# Patient Record
Sex: Female | Born: 1995 | Race: White | Hispanic: No | Marital: Single | Smoking: Never smoker
Health system: Southern US, Community
[De-identification: ages and names within clinical notes are randomized; demographics above are authoritative.]

## PROBLEM LIST (undated history)

## (undated) DIAGNOSIS — G249 Dystonia, unspecified: Secondary | ICD-10-CM

## (undated) DIAGNOSIS — G241 Genetic torsion dystonia: Secondary | ICD-10-CM

## (undated) DIAGNOSIS — L309 Dermatitis, unspecified: Secondary | ICD-10-CM

## (undated) HISTORY — DX: Genetic torsion dystonia: G24.1

## (undated) HISTORY — DX: Dermatitis, unspecified: L30.9

---
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
## 2017-01-07 ENCOUNTER — Telehealth: Payer: Self-pay | Admitting: Neurology

## 2017-01-07 NOTE — Telephone Encounter (Signed)
I received dopa responsive dystonia panel from [facility], which was done through [facility] in [place], [place].  It was negative.

## 2017-11-15 NOTE — Progress Notes (Signed)
Martha Bray was seen today in neurologic consultation at the request of Dr. Charlies Bray at [facility] neurology.  Her PCP is Martha Craze, NP.  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  The records that were made available to me were reviewed.  This patient is accompanied in the office by her mother who supplements the history.  The patient is a 22 y.o. year old female with a history of CP.   Mom states that she was slightly delayed in walking (13 months).  She walked normally initially and then at the age of 26 y/o, her mom noted gait instability, lack of heel toe walking, and walking on the toe.  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  She saw a new neurologist in [place] and he heard the hx that she was normal in the AM and then couldn't walk in the PM and her mom would have to carry her.  He didn't think that she had CP.  He did an LP and was told that genetic markers for DRD were negative.  She was started on carbidopa levodopa at age 70 y/o.  She was originally started on tid and then had decreased tone but they changed the dose and at 1/4 of pill she could walk (was WC bound prior).  Currently she takes, carbidopa/levodopa 25/100 CR in the AM and if she needs it or woke up late she will take a carbidopa/levodopa 25/100 IR as needed.  She does that 2-3 times per week.  If she forgets medicine, she will have her shoulders rise to her ears (either side), her calves will tighten, her back will hyperextend.  She apparently had genetic testing for dopa responsive dystonia that was negative.  She had testing for CSF neurotransmitters in the past that was negative.     Neuroimaging has  previously been performed.  It is not available for my review today.  Mom thinks that it was a CT brain.    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    11/18/17 update: Patient is seen today in follow-up.  She has not been seen in about 1 year.  Patient is doing well on carbidopa/levodopa 25/100 CR, 1 tablet in the morning and if she needs it she will take a carbidopa/levodopa 25/100 IR as needed.  She does that perhaps a few times per week (she estimates about 3).  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  She has not had any falls since our last visit.  She has not had any dyskinesia.  She continues to respond well to levodopa.  She is still attending [employer].  She states that she is now a sophomore.  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  Her goal is stage management.    ALLERGIES:  No Known Allergies  CURRENT MEDICATIONS:  Outpatient Encounter Medications as of 11/18/2017  Medication Sig  . carbidopa-levodopa (SINEMET IR) 25-100 MG per tablet Take 1 tablet by mouth daily as needed.   . Carbidopa-Levodopa ER (SINEMET CR) 25-100 MG tablet controlled release Take 1 tablet by mouth daily.  Marland Kitchen  clotrimazole-betamethasone (LOTRISONE) lotion Apply topically 2 (two) times daily.  Marland Kitchen desogestrel-ethinyl estradiol (KARIVA,AZURETTE,MIRCETTE) 0.15-0.02/0.01 MG (21/5) tablet Take 1 tablet by mouth daily.   No facility-administered encounter medications on file as of 11/18/2017.     PAST MEDICAL HISTORY:   Past Medical History:  Diagnosis Date  . Dermatitis 11/14/2016  . DOPA responsive dystonia   . Dystonia    doparesponsive dystonia    PAST SURGICAL HISTORY:   Past Surgical History:  Procedure Laterality Date  . NO PAST SURGERIES      SOCIAL HISTORY:   Social History   Socioeconomic History  . Marital status: Single    Spouse name: Not on file  . Number of children: Not on file  . Years of education: Not on file  . Highest education level: Not on  file  Social Needs  . Financial resource strain: Not on file  . Food insecurity - worry: Not on file  . Food insecurity - inability: Not on file  . Transportation needs - medical: Not on file  . Transportation needs - non-medical: Not on file  Occupational History  . Occupation: Consulting civil engineer  Tobacco Use  . Smoking status: Never Smoker  . Smokeless tobacco: Never Used  Substance and Sexual Activity  . Alcohol use: No    Alcohol/week: 0.0 oz  . Drug use: No  . Sexual activity: Not on file  Other Topics Concern  . Not on file  Social History Narrative   Lives with Mom    FAMILY HISTORY:   Family Status  Relation Name Status  . Mother  Alive       healthy  . Father  Alive       healthy  . Sister 2 Alive  . Brother 2 Alive  . Sister sepsis Deceased    ROS:  A complete 10 system review of systems was obtained and was unremarkable apart from what is mentioned above.  PHYSICAL EXAMINATION:    VITALS:   There were no vitals filed for this visit.  GEN:  Normal appears female in no acute distress.  Appears stated age. HEENT:  Normocephalic, atraumatic. The mucous membranes are moist. The superficial temporal arteries are without ropiness or tenderness. Cardiovascular: Regular rate and rhythm. Lungs: Clear to auscultation bilaterally. Neck/Heme: There are no carotid bruits noted bilaterally.  NEUROLOGICAL: Orientation:  The patient is alert and oriented x 3.  Fund of knowledge is appropriate.  Recent and remote memory intact.  Attention span and concentration normal.  Repeats and names without difficulty. Cranial nerves: There is good facial symmetry. The pupils are equal round and reactive to light bilaterally. Fundoscopic exam reveals clear disc margins bilaterally. Extraocular muscles are intact and visual fields are full to confrontational testing. Speech is fluent and clear. Soft palate rises symmetrically and there is no tongue deviation. Hearing is intact to conversational  tone. Tone: Tone is good throughout. Sensation: Sensation is intact to light touch throughout Coordination:  The patient has no difficulty with RAM's or FNF bilaterally. Motor: Strength is 5/5 in the bilateral upper and lower extremities.  Shoulder shrug is equal and symmetric. There is no pronator drift.  There are no fasciculations noted. DTR's: Deep tendon reflexes are 2+-3-/4 at the bilateral biceps, triceps, brachioradialis, patella and achilles.  Plantar responses are downgoing bilaterally. Gait and Station: The patient is able to ambulate without difficulty. Able to run down and walk down hall without difficulty   IMPRESSION/PLAN  1. Dopa Responsive Dystonia  -She has been  stable on carbidopa/levodopa 25/100 CR in the AM and carbidopa/levodopa 25/100 IR prn.  She will continue on this medication.  -currently on birth control pill.  Discussed not getting pregnant on carbidopa/levodopa 2.  F/u 1 year, sooner should new issues arise.     Cc:  Martha Bray'Sullivan, Melissa, NP

## 2017-11-18 ENCOUNTER — Encounter: Payer: Self-pay | Admitting: Neurology

## 2017-11-18 ENCOUNTER — Ambulatory Visit: Payer: BLUE CROSS/BLUE SHIELD | Admitting: Neurology

## 2017-11-18 VITALS — BP 110/66 | HR 94 | Ht 64.0 in | Wt 194.0 lb

## 2017-11-18 DIAGNOSIS — G241 Genetic torsion dystonia: Secondary | ICD-10-CM

## 2017-11-18 MED ORDER — CARBIDOPA-LEVODOPA ER 25-100 MG PO TBCR: 1.0000 | EXTENDED_RELEASE_TABLET | Freq: Every day | ORAL | 11 refills | Status: DC

## 2017-11-18 MED ORDER — CARBIDOPA-LEVODOPA 25-100 MG PO TABS: 1.0000 | ORAL_TABLET | Freq: Every day | ORAL | 11 refills | Status: DC | PRN

## 2018-03-12 HISTORY — PX: WISDOM TOOTH EXTRACTION: SHX21

## 2018-11-14 NOTE — Progress Notes (Signed)
Martha Bray was seen today in neurologic consultation at the request of Dr. Charlies Silvers at [facility] neurology.  Her PCP is Sandford Craze, NP.  The consultation is for the ev2aluation of DOPA responsive dystonia.  The records that were made available to me were reviewed.  This patient is accompanied in the office by her mother who supplements the history.  The patient is a 23 y.o. year old female with a history of CP.   Mom states that she was slightly delayed in walking (13 months).  She walked normally initially and then at the age of 41 y/o, her mom noted gait instability, lack of heel toe walking, and walking on the toe.  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  She saw a new neurologist in [place] and he heard the hx that she was normal in the AM and then couldn't walk in the PM and her mom would have to carry her.  He didn't think that she had CP.  He did an LP and was told that genetic markers for DRD were negative.  She was started on carbidopa levodopa at age 71 y/o.  She was originally started on tid and then had decreased tone but they changed the dose and at 1/4 of pill she could walk (was WC bound prior).  Currently she takes, carbidopa/levodopa 25/100 CR in the AM and if she needs it or woke up late she will take a carbidopa/levodopa 25/100 IR as needed.  She does that 2-3 times per week.  If she forgets medicine, she will have her shoulders rise to her ears (either side), her calves will tighten, her back will hyperextend.  She apparently had genetic testing for dopa responsive dystonia that was negative.  She had testing for CSF neurotransmitters in the past that was negative.     Neuroimaging has  previously been performed.  It is not available for my review today.  Mom thinks that it was a CT brain.    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    11/18/17 update: Patient is seen today in follow-up.  She has not been seen in about 1 year.  Patient is doing well on carbidopa/levodopa 25/100 CR, 1 tablet in the morning and if she needs it she will take a carbidopa/levodopa 25/100 IR as needed.  She does that perhaps a few times per week (she estimates about 3).  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  She has not had any falls since our last visit.  She has not had any dyskinesia.  She continues to respond well to levodopa.  She is still attending [employer].  She states that she is now a sophomore.  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  Her goal is stage management.    11/18/18 update: Patient seen today for dopa responsive dystonia.  She is accompanied by her mother who supplements the history.  She feels that she has been stable on carbidopa/levodopa 25/100 CR, 1 tablet in the morning and if she needs it, she takes an additional carbidopa/levodopa 25/100 IR.  She does this 2  or 3 days/week.  She really thinks that she needs to be on the medication.  She thinks it helps.  She denies side effects.  Her mother asks if either the medication or the disease causes anxiety.  She is under more stress and the patient thinks that school stress has just caused more anxiety.  ALLERGIES:  No Known Allergies  CURRENT MEDICATIONS:  Outpatient Encounter Medications as of 11/18/2018  Medication Sig  . carbidopa-levodopa (SINEMET IR) 25-100 MG tablet Take 1 tablet by mouth daily as needed.  . Carbidopa-Levodopa ER (SINEMET CR) 25-100 MG tablet controlled release Take 1 tablet by mouth daily.  . clotrimazole-betamethasone (LOTRISONE) lotion Apply topically 2 (two) times daily.  Marland Kitchen desogestrel-ethinyl estradiol (KARIVA,AZURETTE,MIRCETTE) 0.15-0.02/0.01 MG (21/5) tablet Take  1 tablet by mouth daily.   No facility-administered encounter medications on file as of 11/18/2018.     PAST MEDICAL HISTORY:   Past Medical History:  Diagnosis Date  . Dermatitis 11/14/2016  . DOPA responsive dystonia   . Dystonia    doparesponsive dystonia    PAST SURGICAL HISTORY:   Past Surgical History:  Procedure Laterality Date  . WISDOM TOOTH EXTRACTION  03/2018    SOCIAL HISTORY:   Social History   Socioeconomic History  . Marital status: Single    Spouse name: Not on file  . Number of children: Not on file  . Years of education: Not on file  . Highest education level: Not on file  Occupational History  . Occupation: Consulting civil engineer  Social Needs  . Financial resource strain: Not on file  . Food insecurity:    Worry: Not on file    Inability: Not on file  . Transportation needs:    Medical: Not on file    Non-medical: Not on file  Tobacco Use  . Smoking status: Never Smoker  . Smokeless tobacco: Never Used  Substance and Sexual Activity  . Alcohol use: No    Alcohol/week: 0.0 standard drinks  . Drug use: No  . Sexual activity: Not on file  Lifestyle  . Physical activity:    Days per week: Not on file    Minutes per session: Not on file  . Stress: Not on file  Relationships  . Social connections:    Talks on phone: Not on file    Gets together: Not on file    Attends religious service: Not on file    Active member of club or organization: Not on file    Attends meetings of clubs or organizations: Not on file    Relationship status: Not on file  . Intimate partner violence:    Fear of current or ex partner: Not on file    Emotionally abused: Not on file    Physically abused: Not on file    Forced sexual activity: Not on file  Other Topics Concern  . Not on file  Social History Narrative   Lives with Mom    FAMILY HISTORY:   Family Status  Relation Name Status  . Mother  Alive       healthy  . Father  Alive       healthy  . Sister 2 Alive  .  Brother 2 Alive  . Sister sepsis Deceased    ROS:  Review of Systems  Constitutional: Positive for malaise/fatigue.  HENT: Negative.   Eyes: Negative.   Gastrointestinal: Negative.   Skin: Negative.   Endo/Heme/Allergies: Negative.   Psychiatric/Behavioral: The patient is nervous/anxious.  PHYSICAL EXAMINATION:    VITALS:   Vitals:   11/18/18 1048  BP: 106/72  Pulse: 80  SpO2: 96%  Weight: 201 lb (91.2 kg)  Height: 5\' 4"  (1.626 m)    GEN:  Normal appears female in no acute distress.  Appears stated age. HEENT:  Normocephalic, atraumatic. The mucous membranes are moist. The superficial temporal arteries are without ropiness or tenderness. Cardiovascular: Regular rate and rhythm. Lungs: Clear to auscultation bilaterally. Neck/Heme: There are no carotid bruits noted bilaterally.  NEUROLOGICAL: Orientation:  The patient is alert and oriented x 3.  Fund of knowledge is appropriate.  Recent and remote memory intact.  Attention span and concentration normal.  Repeats and names without difficulty. Cranial nerves: There is good facial symmetry.  Extraocular muscles are intact and visual fields are full to confrontational testing. Speech is fluent and clear. Soft palate rises symmetrically and there is no tongue deviation. Hearing is intact to conversational tone. Tone: Tone is good throughout. Sensation: Sensation is intact to light touch throughout Coordination:  The patient has no difficulty with RAM's or FNF bilaterally. Motor: Strength is 5/5 in the bilateral upper and lower extremities.  Shoulder shrug is equal and symmetric. There is no pronator drift.  There are no fasciculations noted. DTR's: Deep tendon reflexes are 2+-3-/4 at the bilateral biceps, triceps, brachioradialis, patella and achilles.  Plantar responses are downgoing bilaterally. Gait and Station: The patient is able to ambulate without difficulty. Able to run down and walk down hall without difficulty (same as  previous)   IMPRESSION/PLAN  1. Dopa Responsive Dystonia  -She has been stable on carbidopa/levodopa 25/100 CR in the AM and carbidopa/levodopa 25/100 IR prn.  She will continue on this medication.  -currently on birth control pill.  Discussed extensively the importance of not getting pregnant while on this medication.  In her case, I would likely take her off of the medication if she were to try to get pregnant.  Discussed importance of pregnancy planning.  -Chemistry, TSH today  2.  Anxiety  -Discussed with the patient's mother and the patient that I do not think anxiety is related to her levodopa nor dystonia, but rather school stress.  The patient agrees completely.  She is not suicidal or homicidal.  3.  We will plan on seeing the patient back in 1 year.    Cc:  Sandford Craze'Sullivan, Melissa, NP

## 2018-11-18 ENCOUNTER — Ambulatory Visit: Payer: BLUE CROSS/BLUE SHIELD | Admitting: Neurology

## 2018-11-18 ENCOUNTER — Encounter: Payer: Self-pay | Admitting: Neurology

## 2018-11-18 ENCOUNTER — Other Ambulatory Visit (INDEPENDENT_AMBULATORY_CARE_PROVIDER_SITE_OTHER): Payer: BLUE CROSS/BLUE SHIELD

## 2018-11-18 VITALS — BP 106/72 | HR 80 | Ht 64.0 in | Wt 201.0 lb

## 2018-11-18 DIAGNOSIS — G241 Genetic torsion dystonia: Secondary | ICD-10-CM

## 2018-11-18 DIAGNOSIS — F419 Anxiety disorder, unspecified: Secondary | ICD-10-CM

## 2018-11-18 DIAGNOSIS — Z5181 Encounter for therapeutic drug level monitoring: Secondary | ICD-10-CM

## 2018-11-18 MED ORDER — CARBIDOPA-LEVODOPA ER 25-100 MG PO TBCR: 1.0000 | EXTENDED_RELEASE_TABLET | Freq: Every day | ORAL | 11 refills | Status: DC

## 2018-11-18 MED ORDER — CARBIDOPA-LEVODOPA 25-100 MG PO TABS: 1.0000 | ORAL_TABLET | Freq: Every day | ORAL | 11 refills | Status: DC | PRN

## 2018-11-18 NOTE — Patient Instructions (Signed)
1. Your provider has requested that you have labwork completed today. Please go to [facility] ([id number]) on the second floor of this building before leaving the office today. You do not need to check in. If you are not called within 15 minutes please check with the front desk.   

## 2018-11-19 ENCOUNTER — Telehealth: Payer: Self-pay | Admitting: Neurology

## 2018-11-19 LAB — COMPREHENSIVE METABOLIC PANEL
AG Ratio: 1.4 (calc) (ref 1.0–2.5)
ALT: 3 U/L — AB (ref 6–29)
AST: 10 U/L (ref 10–30)
Albumin: 3.7 g/dL (ref 3.6–5.1)
Alkaline phosphatase (APISO): 71 U/L (ref 33–115)
BUN: 14 mg/dL (ref 7–25)
CO2: 25 mmol/L (ref 20–32)
Calcium: 9 mg/dL (ref 8.6–10.2)
Chloride: 105 mmol/L (ref 98–110)
Creat: 0.62 mg/dL (ref 0.50–1.10)
Globulin: 2.7 g/dL (calc) (ref 1.9–3.7)
Glucose, Bld: 78 mg/dL (ref 65–99)
Potassium: 4.2 mmol/L (ref 3.5–5.3)
Sodium: 138 mmol/L (ref 135–146)
Total Bilirubin: 0.3 mg/dL (ref 0.2–1.2)
Total Protein: 6.4 g/dL (ref 6.1–8.1)

## 2018-11-19 LAB — TSH: TSH: 2.27 mIU/L

## 2018-11-19 NOTE — Telephone Encounter (Signed)
Left message on machine for patient to call back.

## 2018-11-19 NOTE — Telephone Encounter (Signed)
-----   Message from Octaviano Batty Tat, DO sent at 11/19/2018 10:02 AM EST ----- I have reviewed all lab results which are normal or stable. Please inform the patient.

## 2018-11-24 NOTE — Telephone Encounter (Signed)
Patient called back and she was made aware labs okay.

## 2019-07-07 LAB — HM PAP SMEAR: HM Pap smear: NEGATIVE

## 2019-11-17 NOTE — Progress Notes (Signed)
Virtual Visit via Video Note The purpose of this virtual visit is to provide medical care while limiting exposure to the novel coronavirus.    Consent was obtained for video visit:  Yes.   Answered questions that patient had about telehealth interaction:  Yes.   I discussed the limitations, risks, security and privacy concerns of performing an evaluation and management service by telemedicine. I also discussed with the patient that there may be a patient responsible charge related to this service. The patient expressed understanding and agreed to proceed.  Pt location: Home Physician Location: office Name of referring provider:  Debbrah Alar, NP I connected with Martha Bray at patients initiation/request on 11/19/2019 at 11:15 AM EST by video enabled telemedicine application and verified that I am speaking with the correct person using two identifiers. 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 My previous records as well as any outside records made available were reviewed prior to todays visit. Patient has been stable in regards to dystonia.   Little more stressed right now and when that happens she will take carbidopa/levodopa bid.  Other times, just qd. Saw GYN in august.  Notes reviewed.  On oral contraceptives.  Current movement d/o meds:  Carbidopa/levodopa 25/100 CR, 1 tablet in the morning Carbidopa/levodopa 25/100 IR in the p.m. (only two-three days per week does she do it bid)   Current Outpatient Medications on File Prior to Visit  Medication Sig Dispense Refill  . carbidopa-levodopa (SINEMET IR) 25-100 MG tablet Take 1 tablet by mouth daily as needed. 30 tablet 11  . Carbidopa-Levodopa ER (SINEMET CR) 25-100 MG tablet controlled release Take 1 tablet by mouth daily. 30 tablet 11  . clotrimazole-betamethasone (LOTRISONE) lotion Apply  topically 2 (two) times daily. 30 mL 0  . desogestrel-ethinyl estradiol (KARIVA,AZURETTE,MIRCETTE) 0.15-0.02/0.01 MG (21/5) tablet Take 1 tablet by mouth daily.     No current facility-administered medications on file prior to visit.     Observations/Objective:   There were no vitals filed for this visit. GEN:  The patient appears stated age and is in NAD.  Neurological examination:  Orientation: The patient is alert and oriented x3. Cranial nerves: There is good facial symmetry. EOMI.  There is no facial hypomimia.  The speech is fluent and clear. Soft palate rises symmetrically and there is no tongue deviation. Hearing is intact to conversational tone. Motor: Strength is at least antigravity x 4.   Shoulder shrug is equal and symmetric.  There is no pronator drift.  Movement examination: Tone: unable Abnormal movements: none Coordination:  There is no decremation with RAM's,  Gait and Station: The patient has no difficulty arising out of a deep-seated chair without the use of the hands. The patient's stride length is good.  Ambulates in a tandem fashion.    I have reviewed and interpreted the following labs independently   Chemistry      Component Value Date/Time   NA 138 11/18/2018 1136   K 4.2 11/18/2018 1136   CL 105 11/18/2018 1136   CO2 25 11/18/2018 1136   BUN 14 11/18/2018 1136   CREATININE 0.62 11/18/2018 1136      Component Value Date/Time   CALCIUM 9.0 11/18/2018 1136   AST 10 11/18/2018 1136   ALT 3 (L) 11/18/2018 1136   BILITOT 0.3 11/18/2018 1136     Lab Results  Component Value Date   TSH 2.27 11/18/2018     Assessment and Plan:   1. DRD  -stable on carbidopa/levodopa 25/100 CR in the AM and carbidopa/levodopa 25/100 IR in the PM (only does bid 2-3 days per week).  RX refilled.  -on OCP.  Avoid pregnancy while on carbidopa/levodopa.  Would probably take her off of carbidopa/levodopa if she were to try to get pregnant.  Follow Up Instructions:  1  year  -I discussed the assessment and treatment plan with the patient. The patient was provided an opportunity to ask questions and all were answered. The patient agreed with the plan and demonstrated an understanding of the instructions.   The patient was advised to call back or seek an in-person evaluation if the symptoms worsen or if the condition fails to improve as anticipated.    No LOS data to display (independent interpretation of test/RX drug management)    Lurena Joiner Fatma Rutten, DO

## 2019-11-18 ENCOUNTER — Encounter: Payer: Self-pay | Admitting: Neurology

## 2019-11-19 ENCOUNTER — Telehealth (INDEPENDENT_AMBULATORY_CARE_PROVIDER_SITE_OTHER): Payer: 59 | Admitting: Neurology

## 2019-11-19 ENCOUNTER — Other Ambulatory Visit: Payer: Self-pay

## 2019-11-19 DIAGNOSIS — G241 Genetic torsion dystonia: Secondary | ICD-10-CM

## 2019-11-19 MED ORDER — CARBIDOPA-LEVODOPA 25-100 MG PO TABS: 1.0000 | ORAL_TABLET | Freq: Every day | ORAL | 11 refills | Status: DC | PRN

## 2019-11-19 MED ORDER — CARBIDOPA-LEVODOPA ER 25-100 MG PO TBCR: 1.0000 | EXTENDED_RELEASE_TABLET | Freq: Every day | ORAL | 11 refills | Status: DC

## 2019-11-24 ENCOUNTER — Telehealth: Payer: Self-pay | Admitting: Family

## 2019-11-24 ENCOUNTER — Ambulatory Visit (INDEPENDENT_AMBULATORY_CARE_PROVIDER_SITE_OTHER): Payer: 59 | Admitting: Family

## 2019-11-24 ENCOUNTER — Encounter: Payer: Self-pay | Admitting: Family

## 2019-11-24 ENCOUNTER — Other Ambulatory Visit: Payer: Self-pay

## 2019-11-24 DIAGNOSIS — F418 Other specified anxiety disorders: Secondary | ICD-10-CM

## 2019-11-24 DIAGNOSIS — G241 Genetic torsion dystonia: Secondary | ICD-10-CM | POA: Diagnosis not present

## 2019-11-24 MED ORDER — SERTRALINE HCL 50 MG PO TABS: ORAL_TABLET | ORAL | 0 refills | Status: DC

## 2019-11-24 NOTE — Telephone Encounter (Signed)
Please contact pt and let her know that I reviewed with Dr. Arbutus Leas and she is fine with [place] starting medication for her anxiety/depression.  I have sent an rx to [facility] for zoloft.  She should follow up in 1 month for cpx/med check please.

## 2019-11-24 NOTE — Progress Notes (Signed)
Virtual Visit via Video Note  I connected with Martha Bray on 11/24/19 at  1:00 PM EST by a video enabled telemedicine application and verified that I am speaking with the correct person using two identifiers.  Location: Patient: home Provider: work   I discussed the limitations of evaluation and management by telemedicine and the availability of in person appointments. The patient expressed understanding and agreed to proceed.  History of Present Illness:  Patient is a 24 yr old female who presents today to re-establish care. My last visit with her was 03/07/16.  Anxiety/Depression- reports that she gets easily overwhelmed.    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  She has never been on meds for anxiety.  Had thoughts of hurting herself a few months back.    No recent weight.  Wt Readings from Last 3 Encounters:  11/18/18 201 lb (91.2 kg)  11/18/17 194 lb (88 kg) (97 %, Z= 1.85)*  12/10/16 187 lb (84.8 kg) (96 %, Z= 1.77)*   * Growth percentiles are based on CDC (Girls, 2-20 Years) data.     Depression screen [facility] 2/9 11/24/2019  Decreased Interest 1  PHQ - 2 Score 1  Tired, decreased energy 3  Change in appetite 1  Feeling bad or failure about yourself  2  Trouble concentrating 2  Moving slowly or fidgety/restless 0  Suicidal thoughts 1  Difficult doing work/chores Somewhat difficult   GAD 7 : Generalized Anxiety Score 11/24/2019  Nervous, Anxious, on Edge 3  Control/stop worrying 3  Worry too much - different things 3  Trouble relaxing 2  Restless 1  Easily annoyed or irritable 2  Afraid - awful might happen 1  Total GAD 7 Score 15  Anxiety Difficulty Very difficult       Observations/Objective:   Gen: Awake, alert, no acute distress Resp: Breathing is even and non-labored Psych: calm/pleasant demeanor Neuro: Alert and Oriented x 3, + facial symmetry, speech is clear.   Assessment and Plan:  Anxiety/Depression- uncontrolled.  Discussed with Dr. Arbutus Leas her neurologist- OK to start SSRI from her standpoint. Will initiate sertraline 50mg .  I instructed pt to start 1/2 tablet once daily for 1 week and then increase to a full tablet once daily on week two as tolerated.  We discussed common side effects such as nausea, drowsiness and weight gain.  Also discussed rare but serious side effect of suicide ideation.  She is instructed to discontinue medication go directly to ED if this occurs.  Pt verbalizes understanding.  Plan follow up in 1 month to evaluate progress.    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  Management per neurology.   20 minutes spent on today's visit.   Follow Up Instructions:    I discussed the assessment and treatment plan with the patient. The patient was provided an opportunity to ask questions and all were answered. The patient agreed with the plan and demonstrated an understanding of the instructions.   The patient was advised to call back or seek an in-person evaluation if the symptoms worsen or if the condition fails to improve as anticipated.  , NP

## 2019-11-25 NOTE — Telephone Encounter (Signed)
Called home and cell numbers but no answer, lvm on cell number for patient to call back about this information, will need to schedule follow up for physical in one month

## 2019-11-27 NOTE — Telephone Encounter (Signed)
Patient advised of provider's comments, she already picked up medication at pharmacy and is taking it. She was scheduled for follow up on 12-29-2019

## 2019-12-21 ENCOUNTER — Telehealth: Payer: Self-pay | Admitting: Family

## 2019-12-24 NOTE — Telephone Encounter (Signed)
Pt called in stating she is out of medication and refill was denied. Pt had appt to see Melissa but it was cancelled by the provider. Pt is rescheduled for 01/19/2020. Pt needs refills until that time. She will be out on 12/26/2019.  [facility] - [place], [place] - [address] Phone:  [phone]  Fax:  [phone]

## 2019-12-25 MED ORDER — SERTRALINE HCL 50 MG PO TABS: 50.0000 mg | ORAL_TABLET | Freq: Every day | ORAL | 0 refills | Status: DC

## 2019-12-25 NOTE — Telephone Encounter (Signed)
30 day supply has been sent  

## 2019-12-25 NOTE — Telephone Encounter (Signed)
Request for refill request on new Zoloft rx, patient had to move 1 month follow up to 3-09 and will be out of medication in a few days.

## 2019-12-25 NOTE — Telephone Encounter (Signed)
Patient advised rx was sent. ?

## 2019-12-29 ENCOUNTER — Encounter: Payer: 59 | Admitting: Family

## 2020-01-19 ENCOUNTER — Other Ambulatory Visit: Payer: Self-pay

## 2020-01-19 ENCOUNTER — Encounter: Payer: Self-pay | Admitting: Family

## 2020-01-19 ENCOUNTER — Ambulatory Visit (INDEPENDENT_AMBULATORY_CARE_PROVIDER_SITE_OTHER): Payer: 59 | Admitting: Family

## 2020-01-19 ENCOUNTER — Emergency Department (HOSPITAL_BASED_OUTPATIENT_CLINIC_OR_DEPARTMENT_OTHER): Payer: 59

## 2020-01-19 ENCOUNTER — Emergency Department (HOSPITAL_BASED_OUTPATIENT_CLINIC_OR_DEPARTMENT_OTHER)
Admission: EM | Admit: 2020-01-19 | Discharge: 2020-01-19 | Disposition: A | Discharge status: Home / Self Care | Payer: 59 | Attending: Emergency Medicine | Admitting: Emergency Medicine

## 2020-01-19 ENCOUNTER — Encounter (HOSPITAL_BASED_OUTPATIENT_CLINIC_OR_DEPARTMENT_OTHER): Payer: Self-pay

## 2020-01-19 DIAGNOSIS — Z793 Long term (current) use of hormonal contraceptives: Secondary | ICD-10-CM | POA: Insufficient documentation

## 2020-01-19 DIAGNOSIS — B2790 Infectious mononucleosis, unspecified without complication: Secondary | ICD-10-CM | POA: Insufficient documentation

## 2020-01-19 DIAGNOSIS — Z79899 Other long term (current) drug therapy: Secondary | ICD-10-CM | POA: Diagnosis not present

## 2020-01-19 DIAGNOSIS — M542 Cervicalgia: Secondary | ICD-10-CM | POA: Diagnosis present

## 2020-01-19 DIAGNOSIS — E860 Dehydration: Secondary | ICD-10-CM

## 2020-01-19 DIAGNOSIS — R221 Localized swelling, mass and lump, neck: Secondary | ICD-10-CM

## 2020-01-19 LAB — CBC WITH DIFFERENTIAL/PLATELET
Abs Immature Granulocytes: 0.05 10*3/uL (ref 0.00–0.07)
Basophils Absolute: 0.1 10*3/uL (ref 0.0–0.1)
Basophils Relative: 1 %
Eosinophils Absolute: 0 10*3/uL (ref 0.0–0.5)
Eosinophils Relative: 0 %
HCT: 42.9 % (ref 36.0–46.0)
Hemoglobin: 13 g/dL (ref 12.0–15.0)
Immature Granulocytes: 1 %
Lymphocytes Relative: 34 %
Lymphs Abs: 2.6 10*3/uL (ref 0.7–4.0)
MCH: 24 pg — ABNORMAL LOW (ref 26.0–34.0)
MCHC: 30.3 g/dL (ref 30.0–36.0)
MCV: 79.2 fL — ABNORMAL LOW (ref 80.0–100.0)
Monocytes Absolute: 0.4 10*3/uL (ref 0.1–1.0)
Monocytes Relative: 5 %
Neutro Abs: 4.4 10*3/uL (ref 1.7–7.7)
Neutrophils Relative %: 59 %
Platelets: 178 10*3/uL (ref 150–400)
RBC: 5.42 MIL/uL — ABNORMAL HIGH (ref 3.87–5.11)
RDW: 14.5 % (ref 11.5–15.5)
Smear Review: NORMAL
WBC Morphology: >10
WBC: 7.5 10*3/uL (ref 4.0–10.5)
nRBC: 0 % (ref 0.0–0.2)

## 2020-01-19 LAB — COMPREHENSIVE METABOLIC PANEL
ALT: 12 U/L (ref 0–44)
AST: 28 U/L (ref 15–41)
Albumin: 3.6 g/dL (ref 3.5–5.0)
Alkaline Phosphatase: 91 U/L (ref 38–126)
Anion gap: 10 (ref 5–15)
BUN: 11 mg/dL (ref 6–20)
CO2: 23 mmol/L (ref 22–32)
Calcium: 8.9 mg/dL (ref 8.9–10.3)
Chloride: 100 mmol/L (ref 98–111)
Creatinine, Ser: 0.86 mg/dL (ref 0.44–1.00)
GFR calc Af Amer: >60 mL/min (ref >60)
GFR calc non Af Amer: >60 mL/min (ref >60)
Glucose, Bld: 105 mg/dL — ABNORMAL HIGH (ref 70–99)
Potassium: 4 mmol/L (ref 3.5–5.1)
Sodium: 133 mmol/L — ABNORMAL LOW (ref 135–145)
Total Bilirubin: 0.4 mg/dL (ref 0.3–1.2)
Total Protein: 8.3 g/dL — ABNORMAL HIGH (ref 6.5–8.1)

## 2020-01-19 LAB — PREGNANCY, URINE: Preg Test, Ur: NEGATIVE

## 2020-01-19 LAB — GROUP A STREP BY PCR: Group A Strep by PCR: NOT DETECTED

## 2020-01-19 LAB — MONONUCLEOSIS SCREEN: Mono Screen: POSITIVE — AB

## 2020-01-19 MED ORDER — IBUPROFEN 600 MG PO TABS: 600.0000 mg | ORAL_TABLET | Freq: Four times a day (QID) | ORAL | 0 refills | Status: DC | PRN

## 2020-01-19 MED ORDER — SODIUM CHLORIDE 0.9 % IV BOLUS
1000.0000 mL | Freq: Once | INTRAVENOUS | Status: AC
  Administered 2020-01-19: 1000 mL via INTRAVENOUS

## 2020-01-19 MED ORDER — IOHEXOL 300 MG/ML  SOLN
100.0000 mL | Freq: Once | INTRAMUSCULAR | Status: AC | PRN
  Administered 2020-01-19: 100 mL via INTRAVENOUS

## 2020-01-19 MED ORDER — KETOROLAC TROMETHAMINE 15 MG/ML IJ SOLN
15.0000 mg | Freq: Once | INTRAMUSCULAR | Status: AC
  Administered 2020-01-19: 15 mg via INTRAVENOUS
  Filled 2020-01-19: qty 1

## 2020-01-19 MED ORDER — ONDANSETRON 4 MG PO TBDP: 4.0000 mg | ORAL_TABLET | Freq: Three times a day (TID) | ORAL | 0 refills | Status: DC | PRN

## 2020-01-19 MED ORDER — DEXAMETHASONE SODIUM PHOSPHATE 10 MG/ML IJ SOLN
10.0000 mg | Freq: Once | INTRAMUSCULAR | Status: AC
  Administered 2020-01-19: 10 mg via INTRAVENOUS
  Filled 2020-01-19: qty 1

## 2020-01-19 NOTE — ED Notes (Signed)
ED Provider at bedside. 

## 2020-01-19 NOTE — Progress Notes (Signed)
Virtual Visit via Video Note  I connected with Martha Bray on 01/19/20 at  8:20 AM EST verified that I am speaking with the correct person using two identifiers. We tried to connect via video but pt had technical issues so the visit was transitioned to a telephone visit.   Location: Patient: car Provider: office   I discussed the limitations of evaluation and management by telemedicine and the availability of in person appointments. The patient expressed understanding and agreed to proceed.  History of Present Illness:  Patient is a 24 yr old female who presents today with c/o extreme swelling of neck bilaterally, reports that it is just beneath my jaw.  Looks like a tangerine.  Notes + ear pressure, not able to keep down any food in a few days. + nausea/vomitting. Feels dehydrated.  + cough.  Reports that her Lymph nodes have been swollen x 1 month.  Does not hurt to swallow.  She has not check her temperature but has felt febrile.   Reports tonsils are red/inflammed.   She was initially scheduled for a physical today, but she was sick and the visit was transitioned to virtual.  Past Medical History:  Diagnosis Date  . Dermatitis 11/14/2016  . DOPA responsive dystonia   . Dystonia    doparesponsive dystonia     Social History   Socioeconomic History  . Marital status: Single    Spouse name: Not on file  . Number of children: Not on file  . Years of education: Not on file  . Highest education level: Not on file  Occupational History  . Occupation: Consulting civil engineer  Tobacco Use  . Smoking status: Never Smoker  . Smokeless tobacco: Never Used  Substance and Sexual Activity  . Alcohol use: No    Alcohol/week: 0.0 standard drinks  . Drug use: No  . Sexual activity: Not on file  Other Topics Concern  . Not on file  Social History Narrative   Lives with Chief Financial Officer at [employer]   Financial Resource Strain:   . Difficulty of Paying Living Expenses: Not  on file  Food Insecurity:   . Worried About Programme researcher, broadcasting/film/video in the Last Year: Not on file  . Ran Out of Food in the Last Year: Not on file  Transportation Needs:   . Lack of Transportation (Medical): Not on file  . Lack of Transportation (Non-Medical): Not on file  Physical Activity:   . Days of Exercise per Week: Not on file  . Minutes of Exercise per Session: Not on file  Stress:   . Feeling of Stress : Not on file  Social Connections:   . Frequency of Communication with Friends and Family: Not on file  . Frequency of Social Gatherings with Friends and Family: Not on file  . Attends Religious Services: Not on file  . Active Member of Clubs or Organizations: Not on file  . Attends Banker Meetings: Not on file  . Marital Status: Not on file  Intimate Partner Violence:   . Fear of Current or Ex-Partner: Not on file  . Emotionally Abused: Not on file  . Physically Abused: Not on file  . Sexually Abused: Not on file    Past Surgical History:  Procedure Laterality Date  . WISDOM TOOTH EXTRACTION  03/2018    No family history on file.  No Known Allergies  Current Outpatient Medications on File Prior to Visit  Medication Sig  Dispense Refill  . Carbidopa-Levodopa ER (SINEMET CR) 25-100 MG tablet controlled release Take 1 tablet by mouth daily. 30 tablet 11  . clotrimazole-betamethasone (LOTRISONE) lotion Apply topically 2 (two) times daily. 30 mL 0  . desogestrel-ethinyl estradiol (KARIVA,AZURETTE,MIRCETTE) 0.15-0.02/0.01 MG (21/5) tablet Take 1 tablet by mouth daily.    . sertraline (ZOLOFT) 50 MG tablet Take 1 tablet (50 mg total) by mouth daily. 30 tablet 0   No current facility-administered medications on file prior to visit.    There were no vitals taken for this visit.   Observations/Objective:   Gen: Awake, alert, no acute distress Resp: Breathing is even and non-labored Psych: calm/pleasant demeanor Neuro: Alert and Oriented x 3, + facial  symmetry, speech is clear.   Assessment and Plan:  Dehydration- advised pt to go to the ED for further evaluation and treatment. Patient is agreeable to plan.  Neck swelling- etiology unclear- ? Strep, needs physical exam/strep testing.  Refer to ER for further evaluation.  Report was given to ED physician on duty at the [facility].    11 minutes spent on today's visit.  Follow Up Instructions:    I discussed the assessment and treatment plan with the patient. The patient was provided an opportunity to ask questions and all were answered. The patient agreed with the plan and demonstrated an understanding of the instructions.   The patient was advised to call back or seek an in-person evaluation if the symptoms worsen or if the condition fails to improve as anticipated.  Nance Pear, NP

## 2020-01-19 NOTE — ED Triage Notes (Signed)
Pt arrives to ED with c/o being sick for about a month, reports she has had some swelling in both sides of her neck X1 month. This Friday developed significant swelling to the right side with NVD, Pt states she has seen bright red blood in her diarrhea. Called her PCP for sick visit and was advised to come to the ED.

## 2020-01-19 NOTE — Discharge Instructions (Signed)
You were given a dose of long-acting steroids in the emergency department today.  This will hopefully help with the swelling in your throat.  I also prescribed you nausea medicine.  Stick to soft liquid diets for the next several days to ensure you keep hydrated.  You should take motrin 600 mg every 6 hours to help with your pain.

## 2020-01-19 NOTE — ED Notes (Signed)
Patient transported to CT 

## 2020-01-19 NOTE — ED Provider Notes (Signed)
[facility] EMERGENCY DEPARTMENT Provider Note   CSN: [id number] Arrival date & time: 01/19/20  6712     History Chief Complaint  Patient presents with  . Neck Pain    Martha Bray is a 24 y.o. female presented to emergency department with sore throat and neck swelling for several days.  She says she been having issues with swelling on both sides of her neck near her lymph nodes for the past month.  The swelling in the right side of her neck near her right cervical anterior lymph node became significantly worse 4 days ago.  She said her gag reflex is kicked up in 7 a hard time keeping down food.  She had a few episodes of emesis.  She is not sure if this related to her gagging when she tries to eat.  She had 1 episodes of diarrhea with some blood streak in it on Friday but has not had any bloody diarrhea since then.  She denies any abdominal pain.  She had a televisit with her primary care clinic today and was referred to the emergency department out of concern for the degree of swelling in her neck as well as possible dehydration.  She has been taking very little p.o. the past several days.  She reports fatigue for the past several days.  She denies fevers or chills.  She denies shortness of breath.  She reports a tonsillitis that was treated in the past year.  She denies any known history of strep throat but is unsure of childhood history.  She still has her tonsils.  She denies any history of peritonsillar abscess.  She denies any history of mono.  She denies any abdominal pain.  HPI     Past Medical History:  Diagnosis Date  . Dermatitis 11/14/2016  . DOPA responsive dystonia   . Dystonia    doparesponsive dystonia    Patient Active Problem List   Diagnosis Date Noted  . Dermatitis 11/14/2016  . DOPA responsive dystonia 03/07/2016    Past Surgical History:  Procedure Laterality Date  . WISDOM TOOTH EXTRACTION  03/2018     OB History   No obstetric history  on file.     No family history on file.  Social History   Tobacco Use  . Smoking status: Never Smoker  . Smokeless tobacco: Never Used  Substance Use Topics  . Alcohol use: No    Alcohol/week: 0.0 standard drinks  . Drug use: No    Home Medications Prior to Admission medications   Medication Sig Start Date End Date Taking? Authorizing Provider  Carbidopa-Levodopa ER (SINEMET CR) 25-100 MG tablet controlled release Take 1 tablet by mouth daily. 11/19/19   Tat, Eustace Quail, DO  clotrimazole-betamethasone (LOTRISONE) lotion Apply topically 2 (two) times daily. 10/30/16   Mosie Lukes, MD  desogestrel-ethinyl estradiol (KARIVA,AZURETTE,MIRCETTE) 0.15-0.02/0.01 MG (21/5) tablet Take 1 tablet by mouth daily. 12/26/15   [provider]  ibuprofen (ADVIL) 600 MG tablet Take 1 tablet (600 mg total) by mouth every 6 (six) hours as needed for up to 30 doses for mild pain or moderate pain. 01/19/20   Wyvonnia Dusky, MD  ondansetron (ZOFRAN ODT) 4 MG disintegrating tablet Take 1 tablet (4 mg total) by mouth every 8 (eight) hours as needed for up to 15 doses for nausea or vomiting. 01/19/20   Wyvonnia Dusky, MD  sertraline (ZOLOFT) 50 MG tablet Take 1 tablet (50 mg total) by mouth daily. 12/25/19  Sandford Craze, NP    Allergies    Patient has no known allergies.  Review of Systems   Review of Systems  Constitutional: Positive for appetite change and fatigue. Negative for chills and fever.  HENT: Positive for sore throat, trouble swallowing and voice change.   Eyes: Negative for photophobia and visual disturbance.  Respiratory: Negative for cough and shortness of breath.   Cardiovascular: Negative for chest pain and palpitations.  Gastrointestinal: Positive for blood in stool, nausea and vomiting. Negative for abdominal pain.  Musculoskeletal: Positive for neck pain. Negative for arthralgias and myalgias.  Skin: Negative for rash and wound.  Neurological: Negative for syncope  and headaches.  Psychiatric/Behavioral: Negative for agitation and confusion.  All other systems reviewed and are negative.   Physical Exam Updated Vital Signs BP 112/74 (BP Location: Right Arm)   Pulse 89   Temp 100 F (37.8 C) (Oral)   Resp 18   Ht 5\' 3"  (1.6 m)   Wt 90.7 kg   LMP 12/27/2019 Comment: neg u preg in ED today   SpO2 98%   BMI 35.43 kg/m   Physical Exam Vitals and nursing note reviewed.  Constitutional:      General: She is not in acute distress.    Appearance: She is well-developed.  HENT:     Head: Normocephalic and atraumatic.     Comments: Firm, enlarged right anterior cervical lymphadenopathy, diffuse adenopathy in the cervical lymph nodes    Mouth/Throat:     Mouth: Mucous membranes are moist.     Tonsils: No tonsillar abscesses.     Comments: Poor visualization of posterior oropharyxn but no uvula deviation, no evidence tonsillar abscess Voice is mildly muffled No drooling, no stridor Eyes:     Conjunctiva/sclera: Conjunctivae normal.  Cardiovascular:     Rate and Rhythm: Regular rhythm. Tachycardia present.     Pulses: Normal pulses.  Pulmonary:     Effort: Pulmonary effort is normal. No respiratory distress.     Breath sounds: Normal breath sounds.  Abdominal:     General: There is no distension.     Palpations: Abdomen is soft.     Tenderness: There is no abdominal tenderness. There is no guarding.     Comments: No splenomegaly  Musculoskeletal:     Cervical back: Neck supple.  Skin:    General: Skin is warm and dry.  Neurological:     Mental Status: She is alert.  Psychiatric:        Mood and Affect: Mood normal.        Behavior: Behavior normal.     ED Results / Procedures / Treatments   Labs (all labs ordered are listed, but only abnormal results are displayed) Labs Reviewed  COMPREHENSIVE METABOLIC PANEL - Abnormal; Notable for the following components:      Result Value   Sodium 133 (*)    Glucose, Bld 105 (*)    Total  Protein 8.3 (*)    All other components within normal limits  CBC WITH DIFFERENTIAL/PLATELET - Abnormal; Notable for the following components:   RBC 5.42 (*)    MCV 79.2 (*)    MCH 24.0 (*)    All other components within normal limits  MONONUCLEOSIS SCREEN - Abnormal; Notable for the following components:   Mono Screen POSITIVE (*)    All other components within normal limits  GROUP A STREP BY PCR  PREGNANCY, URINE    EKG None  Radiology CT Soft Tissue Neck W Contrast  Result Date: 01/19/2020 CLINICAL DATA:  Enlarged right cervical lymph node, sore throat EXAM: CT NECK WITH CONTRAST TECHNIQUE: Multidetector CT imaging of the neck was performed using the standard protocol following the bolus administration of intravenous contrast. CONTRAST:  OMNIPAQUE IOHEXOL 300 MG/ML  SOLN COMPARISON:  None. FINDINGS: Pharynx and larynx: Soft tissue thickening at the posterior wall of the nasopharynx measuring 3.4 cm AP. Enlarged palatine tonsils. Resulting narrowing of the nasopharyngeal and oropharyngeal airway. Parapharyngeal fat is preserved. There is no evidence of peritonsillar abscess. Remainder of pharynx and larynx is unremarkable. Subglottic airway is patent. Salivary glands: Unremarkable. Thyroid: Unremarkable. Lymph nodes: There is bilateral cervical adenopathy. Largest node abuts the right submandibular gland and measures 3.1 cm. Right level 3 node on series 3, image 62 measures 2 cm. Left level 2 node on image 49 measures 2.2 cm. Vascular: Major neck vessels are patent. Limited intracranial: No abnormal enhancement. Visualized orbits: Unremarkable. Mastoids and visualized paranasal sinuses: Minor mucosal thickening. Mastoid air cells are clear. Skeleton: Unremarkable. Upper chest: Included upper lungs are clear. Other: None. IMPRESSION: Enlargement of the nasopharyngeal and oropharyngeal lymphoid tissues. Cervical adenopathy. No evidence of abscess. Considerations include infectious  mononucleosis or other infectious/inflammatory process and lymphoproliferative disease. Electronically Signed   By: Guadlupe Spanish M.D.   On: 01/19/2020 11:37    Procedures Procedures (including critical care time)  Medications Ordered in ED Medications  sodium chloride 0.9 % bolus 1,000 mL (0 mLs Intravenous Stopped 01/19/20 1252)  ketorolac (TORADOL) 15 MG/ML injection 15 mg (15 mg Intravenous Given 01/19/20 1104)  iohexol (OMNIPAQUE) 300 MG/ML solution 100 mL (100 mLs Intravenous Contrast Given 01/19/20 1115)  dexamethasone (DECADRON) injection 10 mg (10 mg Intravenous Given 01/19/20 1200)    ED Course  I have reviewed the triage vital signs and the nursing notes.  Pertinent labs & imaging results that were available during my care of the patient were reviewed by me and considered in my medical decision making (see chart for details).  24 year old female presenting to emergency department with sore throat and swelling of her neck intermittently for the past month.  She has had persistent right-sided neck swelling for several days.  She does have impressive cervical lymphadenopathy on the right side, with firmness of the lymph node.    She'll need a CT scan soft tissue of the neck to evaluate this node for evidence of abscess or necrosis, or malignancy.  She also need CT evaluation of her airway as her voice is mildly muffled.  I can't see a clear PTA on my exam to explain the muffling.  No stridor or drooling, I don't think she has an impending airway emergency.  She is overall well appearing, but tired.  We'll check mono, strep CMP, CBC with diff  Give IVF, toradol No splenomegaly on exam    Final Clinical Impression(s) / ED Diagnoses Final diagnoses:  Infectious mononucleosis without complication, infectious mononucleosis due to unspecified organism    Rx / DC Orders ED Discharge Orders         Ordered    ondansetron (ZOFRAN ODT) 4 MG disintegrating tablet  Every 8 hours PRN      01/19/20 1216    ibuprofen (ADVIL) 600 MG tablet  Every 6 hours PRN     01/19/20 1216           Terald Sleeper, MD 01/19/20 [phone]

## 2020-01-20 ENCOUNTER — Telehealth: Payer: Self-pay | Admitting: Family

## 2020-01-20 NOTE — Telephone Encounter (Signed)
PT is having neck swelling. [facility] sent her to the ED on Tuesday. She stated they only precribed her Ibpropen  But the swelling has gotten worst. Pt is requesting for a stronger med to help  With swelling

## 2020-01-21 ENCOUNTER — Ambulatory Visit (INDEPENDENT_AMBULATORY_CARE_PROVIDER_SITE_OTHER): Payer: 59 | Admitting: Family Medicine

## 2020-01-21 ENCOUNTER — Encounter: Payer: Self-pay | Admitting: Family Medicine

## 2020-01-21 ENCOUNTER — Other Ambulatory Visit: Payer: Self-pay

## 2020-01-21 DIAGNOSIS — B2790 Infectious mononucleosis, unspecified without complication: Secondary | ICD-10-CM

## 2020-01-21 DIAGNOSIS — R05 Cough: Secondary | ICD-10-CM

## 2020-01-21 DIAGNOSIS — R059 Cough, unspecified: Secondary | ICD-10-CM

## 2020-01-21 MED ORDER — HYDROCODONE-HOMATROPINE 5-1.5 MG/5ML PO SYRP: 5.0000 mL | ORAL_SOLUTION | Freq: Three times a day (TID) | ORAL | 0 refills | Status: DC | PRN

## 2020-01-21 MED ORDER — PREDNISONE 20 MG PO TABS: ORAL_TABLET | ORAL | 0 refills | Status: DC

## 2020-01-21 NOTE — Telephone Encounter (Signed)
Patient will be seen today as virtual visit with Dr. Patsy Lager.

## 2020-01-21 NOTE — Progress Notes (Signed)
[facility] [address] [place], [place] [address] [phone]  Date:  01/21/2020   Name:  Martha Bray   DOB:  [birth year]   MRN:  [id number]  PCP:  Sandford Craze, NP    Chief Complaint: No chief complaint on file.   History of Present Illness:  Martha Bray is a 24 y.o. very pleasant female patient who presents with the following:  Patient location is home, provider location is office.  Patient identity confirmed with 2 factors, she gives consent for virtual visit today.  The patient and myself are present on the call today Virtual visit today to discuss sore throat and mono- she did a virtual visit with Melissa on 3/9 - she was concerned about neck swelling/ possible peritonsillar abscess and referred her to the ER  She was seen in the ER and had a CT of her neck and positive monospot, blood work  She continues to feel "terrible"-not worse but just not better. 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 CONTRAST:  OMNIPAQUE IOHEXOL 300 MG/ML  SOLN COMPARISON:  None. FINDINGS: Pharynx and larynx: Soft tissue thickening at the posterior wall of the nasopharynx measuring 3.4 cm AP. Enlarged palatine tonsils. Resulting narrowing of the  nasopharyngeal and oropharyngeal airway. Parapharyngeal fat is preserved. There is no evidence of peritonsillar abscess. Remainder of pharynx and larynx is unremarkable. Subglottic airway is patent. Salivary glands: Unremarkable. Thyroid: Unremarkable. Lymph nodes: There is bilateral cervical adenopathy. Largest node abuts the right submandibular gland and measures 3.1 cm. Right level 3 node on series 3, image 62 measures 2 cm. Left level 2 node on image 49 measures 2.2 cm. Vascular: Major neck vessels are patent. Limited intracranial: No abnormal enhancement. Visualized orbits: Unremarkable. Mastoids and visualized paranasal sinuses: Minor mucosal thickening. Mastoid air cells are clear. Skeleton: Unremarkable. Upper chest: Included upper lungs are clear. Other: None. IMPRESSION: Enlargement of the nasopharyngeal and oropharyngeal lymphoid tissues. Cervical adenopathy. No evidence of abscess. Considerations include infectious mononucleosis or other infectious/inflammatory process and lymphoproliferative disease. Electronically Signed   By: Guadlupe Spanish M.D.   On: 01/19/2020 11:37     Patient Active Problem List   Diagnosis Date Noted  . Dermatitis 11/14/2016  . 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 Dermatitis 11/14/2016  . DOPA responsive dystonia   . Dystonia    doparesponsive dystonia    Past Surgical History:  Procedure Laterality Date  . WISDOM TOOTH EXTRACTION  03/2018    Social History   Tobacco Use  . Smoking status: Never Smoker  . Smokeless tobacco: Never Used  Substance Use Topics  . Alcohol use: No    Alcohol/week: 0.0 standard drinks  . Drug use: No  No family history on file.  No Known Allergies  Medication list has been reviewed and updated.  Current Outpatient Medications on File Prior to Visit  Medication Sig Dispense Refill  . Carbidopa-Levodopa ER (SINEMET CR) 25-100 MG tablet controlled release Take 1 tablet by  mouth daily. 30 tablet 11  . clotrimazole-betamethasone (LOTRISONE) lotion Apply topically 2 (two) times daily. 30 mL 0  . desogestrel-ethinyl estradiol (KARIVA,AZURETTE,MIRCETTE) 0.15-0.02/0.01 MG (21/5) tablet Take 1 tablet by mouth daily.    Marland Kitchen ibuprofen (ADVIL) 600 MG tablet Take 1 tablet (600 mg total) by mouth every 6 (six) hours as needed for up to 30 doses for mild pain or moderate pain. 30 tablet 0  . ondansetron (ZOFRAN ODT) 4 MG disintegrating tablet Take 1 tablet (4 mg total) by mouth every 8 (eight) hours as needed for up to 15 doses for nausea or vomiting. 15 tablet 0  . sertraline (ZOLOFT) 50 MG tablet Take 1 tablet (50 mg total) by mouth daily. 30 tablet 0   No current facility-administered medications on file prior to visit.    Review of Systems:  As per HPI- otherwise negative.   Physical Examination: There were no vitals filed for this visit. There were no vitals filed for this visit. There is no height or weight on file to calculate BMI. Ideal Body Weight:    Patient is observed over video monitor.  She looks nontoxic but uncomfortable with sore throat.  No cough, wheezing, shortness of breath or distress is noted She is not checking vital signs at home  Assessment and Plan: Pharyngitis due to infectious mononucleosis - Plan: predniSONE (DELTASONE) 20 MG tablet  Cough - Plan: HYDROcodone-homatropine (HYCODAN) 5-1.5 MG/5ML syrup  Virtual visit today to discuss persistent pharyngitis and cough.  Patient was seen in the ER 2 days ago and diagnosed with mononucleosis.  She is having a lot of throat pain and it does hurt to swallow.  She is also coughing.  Prescribed Hycodan for her to use for pain relief and cough control Prednisone for throat swelling She may continue ibuprofen or Tylenol as needed for pain I asked her to purchase a thermometer so she can keep track of her temperature Also suggested that she obtain a Covid swab in the next few days, to ensure she does  not actually have COVID-19  She will contact us if any questions or concerns  Signed Lamar Blinks, MD

## 2020-01-21 NOTE — Telephone Encounter (Signed)
Pt needs OV please since Symptoms are worsening.

## 2020-01-26 ENCOUNTER — Ambulatory Visit (INDEPENDENT_AMBULATORY_CARE_PROVIDER_SITE_OTHER): Payer: 59 | Admitting: Family

## 2020-01-26 ENCOUNTER — Observation Stay (HOSPITAL_BASED_OUTPATIENT_CLINIC_OR_DEPARTMENT_OTHER)
Admission: EM | Admit: 2020-01-26 | Discharge: 2020-01-29 | Disposition: A | Discharge status: Home / Self Care | Payer: 59 | Attending: Internal Medicine | Admitting: Internal Medicine

## 2020-01-26 ENCOUNTER — Other Ambulatory Visit: Payer: Self-pay

## 2020-01-26 ENCOUNTER — Encounter (HOSPITAL_COMMUNITY): Payer: Self-pay | Admitting: Internal Medicine

## 2020-01-26 ENCOUNTER — Encounter (HOSPITAL_BASED_OUTPATIENT_CLINIC_OR_DEPARTMENT_OTHER): Payer: Self-pay

## 2020-01-26 ENCOUNTER — Emergency Department (HOSPITAL_BASED_OUTPATIENT_CLINIC_OR_DEPARTMENT_OTHER): Payer: 59

## 2020-01-26 ENCOUNTER — Encounter: Payer: Self-pay | Admitting: Family

## 2020-01-26 DIAGNOSIS — G248 Other dystonia: Secondary | ICD-10-CM | POA: Diagnosis not present

## 2020-01-26 DIAGNOSIS — J384 Edema of larynx: Secondary | ICD-10-CM | POA: Diagnosis not present

## 2020-01-26 DIAGNOSIS — Z79899 Other long term (current) drug therapy: Secondary | ICD-10-CM | POA: Diagnosis not present

## 2020-01-26 DIAGNOSIS — R591 Generalized enlarged lymph nodes: Secondary | ICD-10-CM | POA: Diagnosis present

## 2020-01-26 DIAGNOSIS — Z20822 Contact with and (suspected) exposure to covid-19: Secondary | ICD-10-CM | POA: Diagnosis not present

## 2020-01-26 DIAGNOSIS — J039 Acute tonsillitis, unspecified: Secondary | ICD-10-CM | POA: Diagnosis present

## 2020-01-26 DIAGNOSIS — R590 Localized enlarged lymph nodes: Secondary | ICD-10-CM | POA: Diagnosis present

## 2020-01-26 DIAGNOSIS — R1310 Dysphagia, unspecified: Secondary | ICD-10-CM

## 2020-01-26 DIAGNOSIS — B2790 Infectious mononucleosis, unspecified without complication: Secondary | ICD-10-CM | POA: Diagnosis not present

## 2020-01-26 DIAGNOSIS — J029 Acute pharyngitis, unspecified: Secondary | ICD-10-CM

## 2020-01-26 DIAGNOSIS — G241 Genetic torsion dystonia: Secondary | ICD-10-CM

## 2020-01-26 LAB — COMPREHENSIVE METABOLIC PANEL
ALT: 24 U/L (ref 0–44)
AST: 41 U/L (ref 15–41)
Albumin: 3.1 g/dL — ABNORMAL LOW (ref 3.5–5.0)
Alkaline Phosphatase: 140 U/L — ABNORMAL HIGH (ref 38–126)
Anion gap: 13 (ref 5–15)
BUN: 13 mg/dL (ref 6–20)
CO2: 24 mmol/L (ref 22–32)
Calcium: 8.7 mg/dL — ABNORMAL LOW (ref 8.9–10.3)
Chloride: 100 mmol/L (ref 98–111)
Creatinine, Ser: 0.82 mg/dL (ref 0.44–1.00)
GFR calc Af Amer: >60 mL/min (ref >60)
GFR calc non Af Amer: >60 mL/min (ref >60)
Glucose, Bld: 140 mg/dL — ABNORMAL HIGH (ref 70–99)
Potassium: 3.5 mmol/L (ref 3.5–5.1)
Sodium: 137 mmol/L (ref 135–145)
Total Bilirubin: 0.7 mg/dL (ref 0.3–1.2)
Total Protein: 8.9 g/dL — ABNORMAL HIGH (ref 6.5–8.1)

## 2020-01-26 LAB — RESPIRATORY PANEL BY RT PCR (FLU A&B, COVID)
Influenza A by PCR: NEGATIVE
Influenza B by PCR: NEGATIVE
SARS Coronavirus 2 by RT PCR: NEGATIVE

## 2020-01-26 LAB — CBC WITH DIFFERENTIAL/PLATELET
Abs Immature Granulocytes: 0.61 10*3/uL — ABNORMAL HIGH (ref 0.00–0.07)
Basophils Absolute: 0 10*3/uL (ref 0.0–0.1)
Basophils Relative: 0 %
Eosinophils Absolute: 0.1 10*3/uL (ref 0.0–0.5)
Eosinophils Relative: 0 %
HCT: 40.8 % (ref 36.0–46.0)
Hemoglobin: 12.5 g/dL (ref 12.0–15.0)
Immature Granulocytes: 3 %
Lymphocytes Relative: 36 %
Lymphs Abs: 8.6 10*3/uL — ABNORMAL HIGH (ref 0.7–4.0)
MCH: 24 pg — ABNORMAL LOW (ref 26.0–34.0)
MCHC: 30.6 g/dL (ref 30.0–36.0)
MCV: 78.5 fL — ABNORMAL LOW (ref 80.0–100.0)
Monocytes Absolute: 2.3 10*3/uL — ABNORMAL HIGH (ref 0.1–1.0)
Monocytes Relative: 10 %
Neutro Abs: 12 10*3/uL — ABNORMAL HIGH (ref 1.7–7.7)
Neutrophils Relative %: 51 %
Platelets: 203 10*3/uL (ref 150–400)
RBC: 5.2 MIL/uL — ABNORMAL HIGH (ref 3.87–5.11)
RDW: 15.2 % (ref 11.5–15.5)
WBC: 23.6 10*3/uL — ABNORMAL HIGH (ref 4.0–10.5)
nRBC: 0 % (ref 0.0–0.2)

## 2020-01-26 LAB — GROUP A STREP BY PCR: Group A Strep by PCR: NOT DETECTED

## 2020-01-26 MED ORDER — ENOXAPARIN SODIUM 40 MG/0.4ML ~~LOC~~ SOLN
40.0000 mg | SUBCUTANEOUS | Status: DC
  Administered 2020-01-26 – 2020-01-28 (×3): 40 mg via SUBCUTANEOUS
  Filled 2020-01-26 (×3): qty 0.4

## 2020-01-26 MED ORDER — METHYLPREDNISOLONE SODIUM SUCC 125 MG IJ SOLR
125.0000 mg | Freq: Once | INTRAMUSCULAR | Status: AC
  Administered 2020-01-26: 125 mg via INTRAVENOUS
  Filled 2020-01-26: qty 2

## 2020-01-26 MED ORDER — ENOXAPARIN SODIUM 40 MG/0.4ML ~~LOC~~ SOLN: 40.0000 mg | SUBCUTANEOUS | Status: DC

## 2020-01-26 MED ORDER — ACETAMINOPHEN 325 MG PO TABS
650.0000 mg | ORAL_TABLET | Freq: Four times a day (QID) | ORAL | Status: DC | PRN
  Administered 2020-01-26: 650 mg via ORAL
  Filled 2020-01-26: qty 2

## 2020-01-26 MED ORDER — PIPERACILLIN-TAZOBACTAM 3.375 G IVPB 30 MIN: 3.3750 g | Freq: Three times a day (TID) | INTRAVENOUS | Status: DC

## 2020-01-26 MED ORDER — ACETAMINOPHEN 160 MG/5ML PO SOLN
650.0000 mg | Freq: Once | ORAL | Status: AC
  Administered 2020-01-26: 650 mg via ORAL
  Filled 2020-01-26: qty 20.3

## 2020-01-26 MED ORDER — KETOROLAC TROMETHAMINE 30 MG/ML IJ SOLN
INTRAMUSCULAR | Status: AC
  Filled 2020-01-26: qty 1

## 2020-01-26 MED ORDER — KETOROLAC TROMETHAMINE 30 MG/ML IJ SOLN
30.0000 mg | Freq: Four times a day (QID) | INTRAMUSCULAR | Status: DC | PRN
  Administered 2020-01-27 – 2020-01-28 (×6): 30 mg via INTRAVENOUS
  Filled 2020-01-26 (×6): qty 1

## 2020-01-26 MED ORDER — METHYLPREDNISOLONE SODIUM SUCC 40 MG IJ SOLR
40.0000 mg | Freq: Three times a day (TID) | INTRAMUSCULAR | Status: DC
  Administered 2020-01-26 – 2020-01-29 (×8): 40 mg via INTRAVENOUS
  Filled 2020-01-26 (×8): qty 1

## 2020-01-26 MED ORDER — IOHEXOL 300 MG/ML  SOLN
100.0000 mL | Freq: Once | INTRAMUSCULAR | Status: AC | PRN
  Administered 2020-01-26: 100 mL via INTRAVENOUS

## 2020-01-26 MED ORDER — PIPERACILLIN-TAZOBACTAM 3.375 G IVPB 30 MIN
3.3750 g | Freq: Once | INTRAVENOUS | Status: AC
  Administered 2020-01-26: 3.375 g via INTRAVENOUS
  Filled 2020-01-26 (×2): qty 50

## 2020-01-26 MED ORDER — CARBIDOPA-LEVODOPA ER 25-100 MG PO TBCR
1.0000 | EXTENDED_RELEASE_TABLET | Freq: Every day | ORAL | Status: DC
  Administered 2020-01-27 (×2): 1 via ORAL
  Filled 2020-01-26 (×3): qty 1

## 2020-01-26 MED ORDER — ACETAMINOPHEN 650 MG RE SUPP: 650.0000 mg | Freq: Four times a day (QID) | RECTAL | Status: DC | PRN

## 2020-01-26 MED ORDER — SODIUM CHLORIDE 0.9 % IV SOLN: INTRAVENOUS | Status: DC

## 2020-01-26 MED ORDER — SODIUM CHLORIDE 0.9 % IV BOLUS
1000.0000 mL | Freq: Once | INTRAVENOUS | Status: AC
  Administered 2020-01-26: 1000 mL via INTRAVENOUS

## 2020-01-26 MED ORDER — PIPERACILLIN-TAZOBACTAM 3.375 G IVPB
3.3750 g | Freq: Three times a day (TID) | INTRAVENOUS | Status: DC
  Administered 2020-01-26 – 2020-01-29 (×8): 3.375 g via INTRAVENOUS
  Filled 2020-01-26 (×8): qty 50

## 2020-01-26 MED ORDER — LIDOCAINE VISCOUS HCL 2 % MT SOLN
15.0000 mL | Freq: Once | OROMUCOSAL | Status: AC
  Administered 2020-01-26: 15 mL via OROMUCOSAL
  Filled 2020-01-26: qty 15

## 2020-01-26 MED ORDER — DEXTROSE-NACL 5-0.45 % IV SOLN: INTRAVENOUS | Status: AC

## 2020-01-26 MED ORDER — SALINE SPRAY 0.65 % NA SOLN
1.0000 | NASAL | Status: DC | PRN
  Administered 2020-01-26: 1 via NASAL
  Filled 2020-01-26: qty 44

## 2020-01-26 MED ORDER — MORPHINE SULFATE (PF) 4 MG/ML IV SOLN
4.0000 mg | Freq: Once | INTRAVENOUS | Status: AC
  Administered 2020-01-26: 4 mg via INTRAVENOUS
  Filled 2020-01-26: qty 1

## 2020-01-26 MED ORDER — TRAZODONE HCL 50 MG PO TABS: 25.0000 mg | ORAL_TABLET | Freq: Every evening | ORAL | Status: DC | PRN

## 2020-01-26 NOTE — ED Provider Notes (Signed)
[facility] EMERGENCY DEPARTMENT Provider Note   CSN: [id number] Arrival date & time: 01/26/20  1239     History Chief Complaint  Patient presents with  . Sore Throat    Martha Bray is a 24 y.o. female with past medical history significant for dopa responsive dystonia presents to emergency department today with chief complaint of progressively worsening sore throat x 1 week.  Patient had a telemedicine visit with PCP earlier today and was sent to the emergency department for further evaluation.  Patient was seen in the ED on 01/19/2020 and tested positive for mono, negative for strep.  She had CT scan soft tissue that showed enlargement of nasopharyngeal and oropharyngeal lymphoid tissues, cervical adenopathy, no abscess.  Discharged home with Advil and Zofran.  Patient states she followed up with her PCP 2 days later because she was feeling worse.  She was given 5 days of p.o. prednisone.  She states she took that as prescribed however continued to get worse. Today she is describing difficulty swallowing, sore throat, inability to tolerate p.o. intake, neck swelling, nasal congestion, nausea. She took hydrocodone cough medicine prior to arrival.  History provided by patient with additional history obtained from chart review.      Past Medical History:  Diagnosis Date  . Dermatitis 11/14/2016  . DOPA responsive dystonia   . Dystonia    doparesponsive dystonia    Patient Active Problem List   Diagnosis Date Noted  . Cervical lymphadenopathy 01/26/2020  . Dermatitis 11/14/2016  . DOPA responsive dystonia 03/07/2016    Past Surgical History:  Procedure Laterality Date  . WISDOM TOOTH EXTRACTION  03/2018     OB History   No obstetric history on file.     No family history on file.  Social History   Tobacco Use  . Smoking status: Never Smoker  . Smokeless tobacco: Never Used  Substance Use Topics  . Alcohol use: No    Alcohol/week: 0.0 standard drinks  .  Drug use: No    Home Medications Prior to Admission medications   Medication Sig Start Date End Date Taking? Authorizing Provider  Carbidopa-Levodopa ER (SINEMET CR) 25-100 MG tablet controlled release Take 1 tablet by mouth daily. 11/19/19   Tat, Octaviano Batty, DO  clotrimazole-betamethasone (LOTRISONE) lotion Apply topically 2 (two) times daily. 10/30/16   Bradd Canary, MD  desogestrel-ethinyl estradiol (KARIVA,AZURETTE,MIRCETTE) 0.15-0.02/0.01 MG (21/5) tablet Take 1 tablet by mouth daily. 12/26/15   [provider]  HYDROcodone-homatropine (HYCODAN) 5-1.5 MG/5ML syrup Take 5 mLs by mouth every 8 (eight) hours as needed for up to 5 days for cough. 01/21/20 01/26/20  Copland, Gwenlyn Found, MD  ibuprofen (ADVIL) 600 MG tablet Take 1 tablet (600 mg total) by mouth every 6 (six) hours as needed for up to 30 doses for mild pain or moderate pain. 01/19/20   Terald Sleeper, MD  ondansetron (ZOFRAN ODT) 4 MG disintegrating tablet Take 1 tablet (4 mg total) by mouth every 8 (eight) hours as needed for up to 15 doses for nausea or vomiting. 01/19/20   Terald Sleeper, MD  predniSONE (DELTASONE) 20 MG tablet Take 2 pills daily for 3 days, then take 1 pill daily for 3 days 01/21/20   Copland, Gwenlyn Found, MD  sertraline (ZOLOFT) 50 MG tablet Take 1 tablet (50 mg total) by mouth daily. 12/25/19   Sandford Craze, NP    Allergies    Patient has no known allergies.  Review of Systems   Review  of Systems  All other systems are reviewed and are negative for acute change except as noted in the HPI.   Physical Exam Updated Vital Signs BP 136/89 (BP Location: Right Arm)   Pulse (!) 139   Temp 98.1 F (36.7 C) (Oral)   Resp 18   Ht 5\' 3"  (1.6 m)   Wt 91.2 kg   LMP 12/27/2019 Comment: neg upreg in ED   SpO2 96%   BMI 35.62 kg/m   Physical Exam Vitals and nursing note reviewed.  Constitutional:      General: She is not in acute distress.    Appearance: She is ill-appearing. She is not  toxic-appearing or diaphoretic.     Comments: Airway is intact.  HENT:     Head: Normocephalic and atraumatic.     Comments: No sinus or temporal tenderness.    Right Ear: Tympanic membrane and external ear normal.     Left Ear: Tympanic membrane and external ear normal.     Nose: Rhinorrhea present.     Mouth/Throat:     Mouth: Mucous membranes are moist.     Pharynx: Oropharynx is clear. No uvula swelling.     Tonsils: Tonsillar exudate present. No tonsillar abscesses. 4+ on the right. 4+ on the left.     Comments: Uvula is midline. Voice is muffled. No drooling, no stridor. Eyes:     General: No scleral icterus.       Right eye: No discharge.        Left eye: No discharge.     Extraocular Movements: Extraocular movements intact.     Conjunctiva/sclera: Conjunctivae normal.     Pupils: Pupils are equal, round, and reactive to light.  Neck:     Vascular: No JVD.     Comments: Posterior cervical lymphadenopathy  Cardiovascular:     Rate and Rhythm: Regular rhythm. Tachycardia present.     Pulses: Normal pulses.          Radial pulses are 2+ on the right side and 2+ on the left side.     Heart sounds: Normal heart sounds.  Pulmonary:     Comments: Lungs clear to auscultation in all fields. Symmetric chest rise. No wheezing, rales, or rhonchi. Abdominal:     Palpations: Abdomen is soft.     Comments: Abdomen is soft, non-distended, and non-tender in all quadrants. No rigidity, no guarding. No peritoneal signs.  No splenomegaly.  Musculoskeletal:        General: Normal range of motion.     Cervical back: Normal range of motion.  Lymphadenopathy:     Cervical: Cervical adenopathy (R >L, lemon sized mass) present.  Skin:    General: Skin is warm and dry.     Capillary Refill: Capillary refill takes less than 2 seconds.  Neurological:     Mental Status: She is oriented to person, place, and time.     GCS: GCS eye subscore is 4. GCS verbal subscore is 5. GCS motor subscore is 6.      Comments: Fluent speech, no facial droop.  Psychiatric:        Mood and Affect: Affect is tearful.        Behavior: Behavior normal.       ED Results / Procedures / Treatments   Labs (all labs ordered are listed, but only abnormal results are displayed) Labs Reviewed  COMPREHENSIVE METABOLIC PANEL - Abnormal; Notable for the following components:      Result Value  Glucose, Bld 140 (*)    Calcium 8.7 (*)    Total Protein 8.9 (*)    Albumin 3.1 (*)    Alkaline Phosphatase 140 (*)    All other components within normal limits  CBC WITH DIFFERENTIAL/PLATELET - Abnormal; Notable for the following components:   WBC 23.6 (*)    RBC 5.20 (*)    MCV 78.5 (*)    MCH 24.0 (*)    Neutro Abs 12.0 (*)    Lymphs Abs 8.6 (*)    Monocytes Absolute 2.3 (*)    Abs Immature Granulocytes 0.61 (*)    All other components within normal limits  GROUP A STREP BY PCR  RESPIRATORY PANEL BY RT PCR (FLU A&B, COVID)  PATHOLOGIST SMEAR REVIEW    EKG None  Radiology CT Soft Tissue Neck W Contrast  Result Date: 01/26/2020 CLINICAL DATA:  Lymphadenopathy EXAM: CT NECK WITH CONTRAST TECHNIQUE: Multidetector CT imaging of the neck was performed using the standard protocol following the bolus administration of intravenous contrast. CONTRAST:  OMNIPAQUE IOHEXOL 300 MG/ML  SOLN COMPARISON:  01/19/2020 FINDINGS: Pharynx and larynx: Significant enlargement of adenoids and palatine tonsils. Lingual tonsils are also prominent. No evidence of peritonsillar abscess. There is narrowing of the nasopharyngeal and oropharyngeal airway. Remainder of the pharynx is unremarkable with patent airway. Salivary glands: Unremarkable. Thyroid: Normal Lymph nodes: Adenopathy is present. Largest node on the right at level 2/3 measures 3.3 cm (previously 3.1 cm). Largest node on the left at level 2 measures 2.6 cm (previously 2.2 cm). Vascular: Major neck vessels are patent. There is compression of bilateral internal  jugular veins coursing through the adenopathy. Limited intracranial: No abnormal enhancement Visualized orbits: Unremarkable. Mastoids and visualized paranasal sinuses: Paranasal sinus inflammatory changes including a small right maxillary sinus air-fluid level. Mastoid air cells are clear. Skeleton: Unremarkable. Upper chest: Included upper lungs are clear. Other: None. IMPRESSION: Persistent enlargement of lymphoid tissues of the nasopharynx and oropharynx with some progression resulting in airway narrowing. There remains no evidence of abscess formation. Cervical adenopathy including bulky level 2/3 nodes, which have slightly increased in size. Increased paranasal sinus inflammatory changes including a right maxillary sinus air-fluid level. This a nonspecific finding that can be seen setting of acute sinusitis. Electronically Signed   By: Guadlupe Spanish M.D.   On: 01/26/2020 16:03    Procedures Procedures (including critical care time)  Medications Ordered in ED Medications  0.9 %  sodium chloride infusion ( Intravenous New Bag/Given 01/26/20 1614)  sodium chloride 0.9 % bolus 1,000 mL (1,000 mLs Intravenous New Bag/Given 01/26/20 1323)  methylPREDNISolone sodium succinate (SOLU-MEDROL) 125 mg/2 mL injection 125 mg (125 mg Intravenous Given 01/26/20 1316)  morphine 4 MG/ML injection 4 mg (4 mg Intravenous Given 01/26/20 1416)  piperacillin-tazobactam (ZOSYN) IVPB 3.375 g (3.375 g Intravenous New Bag/Given 01/26/20 1523)  iohexol (OMNIPAQUE) 300 MG/ML solution 100 mL (100 mLs Intravenous Contrast Given 01/26/20 1539)    ED Course  I have reviewed the triage vital signs and the nursing notes.  Pertinent labs & imaging results that were available during my care of the patient were reviewed by me and considered in my medical decision making (see chart for details).  Clinical Course as of Jan 25 1654  Tue Jan 26, 2020  1531 Case discussed with hospitalist Dr. Margo Aye. There could be delay getting patient  transferred to [facility] for admission, so will CT neck here to look for abscess or to see any other concerning findings. Patient updated  on plan and is stable at time time. Airway remains intact   [KA]  1647 Ct scan of neck without abscess.  Does show increasing cervical adenopathy compared to scan x1 week ago.  Also has findings suggestive of acute sinusitis.  On my exam patient has no sinus tenderness.    [KA]    Clinical Course User Index [KA] Jakiya Bookbinder, Caroleen Hamman, PA-C   MDM Rules/Calculators/A&P                      Patient seen and examined. Patient presents awake, alert, hemodynamically stable, afebrile, non toxic. She is tachycardic in triage to 139. On exam her airway is intact.  She has muffled voice without drooling or stridor.  Bilateral tonsils 4+ with exudate.  Uvula is not deviated.  No PTA noted.  No splenomegaly. IV fluids, Solu-Medrol and pain medicine given.  She had negative pregnancy test on last ED visit, did not repeat today.  On reassessment tachycardia has improved after fluids.  Heart rate now 105-110.  CBC with leukocytosis of 23.6. Patient did recently finish PO prednisone which could be contributing to leukocytosis however with neutrophils also elevated suspect underlying infection. Will start broad antibiotc coverage with zosyn.  CMP remarkable for glucose of 140.  No severe electrolyte derangement, no renal insufficiency.  Covid PCR test is negative.  Strep test is negative. This case was discussed with Dr. Denton Lank who has seen the patient and agrees with plan to admit.  Spoke with Dr. Margo Aye  with hospitalist service who agrees to assume care of patient and bring into the hospital for further evaluation and management.    Hospitalist requested ENT consult.  I discussed case with on-call ENT attending Dr. Donell Beers.  He asked for ENT to be notified again if patient is not responding to steroids and antibiotics in 24 hours.  Patient updated on scan results, no abscess. Patient  tolerating PO fluids, maintenance fluids ordered. Patient stable at time of disposition. Will continue to closely monitor.  Portions of this note were generated with Scientist, clinical (histocompatibility and immunogenetics). Dictation errors may occur despite best attempts at proofreading.   Final Clinical Impression(s) / ED Diagnoses Final diagnoses:  Pharyngitis due to infectious mononucleosis    Rx / DC Orders ED Discharge Orders    None       Sherene Sires, PA-C 01/26/20 1655    Cathren Laine, MD 01/29/20 1456

## 2020-01-26 NOTE — ED Notes (Signed)
Pt given room temp gingerale

## 2020-01-26 NOTE — ED Notes (Signed)
Report called to400 jessica [facility] called

## 2020-01-26 NOTE — ED Notes (Signed)
[facility] here to get pt 

## 2020-01-26 NOTE — ED Triage Notes (Signed)
Pt was sent by PCP after virtual visit for sore throat and worsening trouble swallowing. Pt voice muffled. Pt also reports NV.

## 2020-01-26 NOTE — Progress Notes (Signed)
Virtual Visit via Video Note  I connected with Martha Bray on 01/26/20 at 11:20 AM EDT by a video enabled telemedicine application and verified that I am speaking with the correct person using two identifiers.  Location: Patient: home Provider: work   I discussed the limitations of evaluation and management by telemedicine and the availability of in person appointments. The patient expressed understanding and agreed to proceed.  History of Present Illness:  Patient is a 24 yr old female who presents today with chief complaint of throat pain. She was diagnosed with Mono on 3/9 in the ED.  She was re-evaluated on 3/11 due to throat pain and swelling. She was prescribed prednisone which has not been helping her.  Mother is present on today's video call and provides much of the history as patient is unable to speak due to tonsillar swelling.  Last night she tried to eat dinner but could not pass the food past her tonsils so she "kept throwing it up."  She is able to take small amounts of water but only when she is using the hycodan liquid.    Mother reports that she noted "white pussy stuff all over her tonsils."  Daughter complains of neck swelling.   Past Medical History:  Diagnosis Date  . Dermatitis 11/14/2016  . DOPA responsive dystonia   . Dystonia    doparesponsive dystonia     Social History   Socioeconomic History  . Marital status: Single    Spouse name: Not on file  . Number of children: Not on file  . Years of education: Not on file  . Highest education level: Not on file  Occupational History  . Occupation: Consulting civil engineer  Tobacco Use  . Smoking status: Never Smoker  . Smokeless tobacco: Never Used  Substance and Sexual Activity  . Alcohol use: No    Alcohol/week: 0.0 standard drinks  . Drug use: No  . Sexual activity: Not on file  Other Topics Concern  . Not on file  Social History Narrative   Lives with Chief Financial Officer at [employer]    Financial Resource Strain:   . Difficulty of Paying Living Expenses:   Food Insecurity:   . Worried About Programme researcher, broadcasting/film/video in the Last Year:   . Barista in the Last Year:   Transportation Needs:   . Freight forwarder (Medical):   Marland Kitchen Lack of Transportation (Non-Medical):   Physical Activity:   . Days of Exercise per Week:   . Minutes of Exercise per Session:   Stress:   . Feeling of Stress :   Social Connections:   . Frequency of Communication with Friends and Family:   . Frequency of Social Gatherings with Friends and Family:   . Attends Religious Services:   . Active Member of Clubs or Organizations:   . Attends Banker Meetings:   Marland Kitchen Marital Status:   Intimate Partner Violence:   . Fear of Current or Ex-Partner:   . Emotionally Abused:   Marland Kitchen Physically Abused:   . Sexually Abused:     Past Surgical History:  Procedure Laterality Date  . WISDOM TOOTH EXTRACTION  03/2018    No family history on file.  No Known Allergies  No current facility-administered medications on file prior to visit.   Current Outpatient Medications on File Prior to Visit  Medication Sig Dispense Refill  . Carbidopa-Levodopa ER (SINEMET CR) 25-100 MG tablet controlled release  Take 1 tablet by mouth daily. 30 tablet 11  . clotrimazole-betamethasone (LOTRISONE) lotion Apply topically 2 (two) times daily. 30 mL 0  . desogestrel-ethinyl estradiol (KARIVA,AZURETTE,MIRCETTE) 0.15-0.02/0.01 MG (21/5) tablet Take 1 tablet by mouth daily.    Marland Kitchen HYDROcodone-homatropine (HYCODAN) 5-1.5 MG/5ML syrup Take 5 mLs by mouth every 8 (eight) hours as needed for up to 5 days for cough. 75 mL 0  . ibuprofen (ADVIL) 600 MG tablet Take 1 tablet (600 mg total) by mouth every 6 (six) hours as needed for up to 30 doses for mild pain or moderate pain. 30 tablet 0  . ondansetron (ZOFRAN ODT) 4 MG disintegrating tablet Take 1 tablet (4 mg total) by mouth every 8 (eight) hours as needed for up to 15  doses for nausea or vomiting. 15 tablet 0  . predniSONE (DELTASONE) 20 MG tablet Take 2 pills daily for 3 days, then take 1 pill daily for 3 days 9 tablet 0  . sertraline (ZOLOFT) 50 MG tablet Take 1 tablet (50 mg total) by mouth daily. 30 tablet 0    LMP 12/27/2019 Comment: neg upreg in ED    Observations/Objective:   Gen: Awake, alert, appears uncomfortable Neck: large mass noted beneath right jawline (size of large lemon), slightly smaller mass beneath left jawline Resp: Breathing is even and non-labored Psych: calm/pleasant demeanor Neuro: Alert. speech garbled and difficult to understand.    Assessment and Plan:  Pharyngitis- severe per pt report and what I am able to gather on today's virtual visit.  She does not seem to be making it at home.  She really needs some follow up labs/CT neck, IV fluids and possibly admission for IV steroid/ENT consult since her symptoms continue to worsen at home.  I have advised pt and her mother to go to the [place] ED and they are agreeable.  Also, I gave report to Dr. Rockie Neighbours nurse (at the [facility] ED) on the patient.   Follow Up Instructions:    I discussed the assessment and treatment plan with the patient. The patient was provided an opportunity to ask questions and all were answered. The patient agreed with the plan and demonstrated an understanding of the instructions.   The patient was advised to call back or seek an in-person evaluation if the symptoms worsen or if the condition fails to improve as anticipated.  Nance Pear, NP

## 2020-01-26 NOTE — H&P (Signed)
History and Physical    Martha Bray WUX:[id number] DOB: 09-16-1996 DOA: 01/26/2020  PCP: Debbrah Alar, NP (Confirm with patient/family/NH records and if not entered, this has to be [facility] at [facility] point [facility]) Patient coming from: [facility] transfer - was from home  I have personally briefly reviewed patient's old medical records in [place]  Chief Complaint: throat swelling and swallow difficulty  HPI: Martha Bray is a 24 y.o. female with medical history significant of dopa responsive dystonia presents to emergency department today with chief complaint of progressively worsening sore throat x 1 week.  Patient had a telemedicine visit with PCP earlier today and was sent to the emergency department for further evaluation.  Patient was seen in the ED on 01/19/2020 and tested positive for mono, negative for strep.  She had CT scan soft tissue that showed enlargement of nasopharyngeal and oropharyngeal lymphoid tissues, cervical adenopathy, no abscess.  Discharged home with Advil and Zofran.  Patient states she followed up with her PCP 2 days later because she was feeling worse.  She was given 5 days of p.o. prednisone.  She states she took that as prescribed however continued to get worse. Today she is describing difficulty swallowing, sore throat, inability to tolerate p.o. intake, neck swelling, nasal congestion, nausea. She took hydrocodone cough medicine prior to arrival. (For level 3, the HPI must include 4+ descriptors: Location, Quality, Severity, Duration, Timing, Context, modifying factors, associated signs/symptoms and/or status of 3+ chronic problems.)  (Please avoid self-populating past medical history here) (The initial 2-3 lines should be focused and good to copy and paste in the HPI section of the daily progress note).  ED Course: Patient hemodynamically stable in ED. Lab remarkable for leukocytosis with normal diff, glucose  140, CT neck with adenopathy, no abscess. Given  IV  Solumedrol in ED. No stridor or respiratory compromise. Due continue swallow issues and laryngeal edema transferred to [facility] and admitted to [facility]  Review of Systems: As per HPI otherwise 10 point review of systems negative. Does endorse upper airway wheezing. Unacceptable ROS statements: "10 systems reviewed," "Extensive" (without elaboration).  Acceptable ROS statements: "All others negative," "All others reviewed and are negative," and "All others unremarkable," with at [place] documented Can't double dip - if using for HPI can't use for ROS  Past Medical History:  Diagnosis Date  . Dermatitis 11/14/2016  . DOPA responsive dystonia   . Dystonia    doparesponsive dystonia    Past Surgical History:  Procedure Laterality Date  . WISDOM TOOTH EXTRACTION  03/2018   Soc Hx - single woman, currently with pandemic living at home. 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 Sexually active.    reports that she has never smoked. She has never used smokeless tobacco. She reports that she does not drink alcohol or use drugs.  No Known Allergies  History reviewed. No pertinent family history. Unacceptable: Noncontributory, unremarkable, or negative. Acceptable: Family history reviewed and not pertinent (If you reviewed it)  Prior to Admission medications   Medication Sig Start Date End Date Taking? Authorizing Provider  Carbidopa-Levodopa ER (SINEMET CR) 25-100 MG tablet controlled release Take 1 tablet by mouth daily. 11/19/19  Yes Tat, Eustace Quail, DO  HYDROcodone-homatropine (HYCODAN) 5-1.5 MG/5ML syrup Take 5 mLs by mouth every 8 (eight) hours as needed for up to 5 days for cough. 01/21/20 01/26/20 Yes Copland, Gay Filler, MD  ibuprofen (ADVIL) 600 MG tablet Take 1 tablet (600 mg total) by mouth every 6 (six) hours  as needed for up to 30 doses for mild pain or moderate pain. 01/19/20  Yes Terald Sleeper, MD  ondansetron (ZOFRAN ODT) 4 MG disintegrating tablet Take 1 tablet (4 mg total) by mouth every 8  (eight) hours as needed for up to 15 doses for nausea or vomiting. 01/19/20  Yes Terald Sleeper, MD  predniSONE (DELTASONE) 20 MG tablet Take 2 pills daily for 3 days, then take 1 pill daily for 3 days 01/21/20  Yes Copland, Gwenlyn Found, MD  sertraline (ZOLOFT) 50 MG tablet Take 1 tablet (50 mg total) by mouth daily. 12/25/19  Yes Sandford Craze, NP  clotrimazole-betamethasone (LOTRISONE) lotion Apply topically 2 (two) times daily. 10/30/16   Bradd Canary, MD  desogestrel-ethinyl estradiol (KARIVA,AZURETTE,MIRCETTE) 0.15-0.02/0.01 MG (21/5) tablet Take 1 tablet by mouth daily. 12/26/15   [provider]    Physical Exam: Vitals:   01/26/20 1714 01/26/20 1715 01/26/20 1849 01/26/20 1945  BP: 121/80 121/80 (!) 127/98 129/89  Pulse: (!) 107 100 82 97  Resp: 18  20 18   Temp:   98.3 F (36.8 C) 97.6 F (36.4 C)  TempSrc:   Oral Oral  SpO2: 97% 97%  98%  Weight:      Height:        Constitutional: NAD, calm, comfortable Vitals:   01/26/20 1714 01/26/20 1715 01/26/20 1849 01/26/20 1945  BP: 121/80 121/80 (!) 127/98 129/89  Pulse: (!) 107 100 82 97  Resp: 18  20 18   Temp:   98.3 F (36.8 C) 97.6 F (36.4 C)  TempSrc:   Oral Oral  SpO2: 97% 97%  98%  Weight:      Height:       General: WNWD overweight woman in some distress Eyes: PERRL, lids and conjunctivae normal ENMT: Mucous membranes are moist. Posterior pharynx with enlarged tonsils with thick adherent exudate. Normal Dentition. Nodes - very enlarged submandibular lymph nodes bilaterally. No supraclavicular or axillary adenopathy. Neck: normal, supple, no thyromegaly Respiratory: clear to auscultation bilaterally, no wheezing, no crackles. Normal respiratory effort. No accessory muscle use.  Cardiovascular: Regular rate and rhythm, no murmurs / rubs / gallops. No extremity edema. 2+ pedal pulses. No carotid bruits.  Abdomen: no tenderness, no masses palpated. No hepatosplenomegaly. Bowel sounds positive.    Musculoskeletal: no clubbing / cyanosis. No joint deformity upper and lower extremities. Good ROM, no contractures. Normal muscle tone.  Skin: no rashes, lesions, ulcers. No induration Neurologic: CN 2-12 grossly intact. Sensation intact, DTR normal. Strength 5/5 in all 4.  Psychiatric: Normal judgment and insight. Alert and oriented x 3. Normal mood.   (Anything < 9 systems with 2 bullets each down codes to level 1) (If patient refuses exam can't bill higher level) (Make sure to document decubitus ulcers present on admission -- if possible -- and whether patient has chronic indwelling catheter at time of admission)  Labs on Admission: I have personally reviewed following labs and imaging studies  CBC: Recent Labs  Lab 01/26/20 1313  WBC 23.6*  NEUTROABS 12.0*  HGB 12.5  HCT 40.8  MCV 78.5*  PLT 203   Basic Metabolic Panel: Recent Labs  Lab 01/26/20 1313  NA 137  K 3.5  CL 100  CO2 24  GLUCOSE 140*  BUN 13  CREATININE 0.82  CALCIUM 8.7*   GFR: Estimated Creatinine Clearance: 116.3 mL/min (by C-G formula based on SCr of 0.82 mg/dL). Liver Function Tests: Recent Labs  Lab 01/26/20 1313  AST 41  ALT 24  ALKPHOS 140*  BILITOT 0.7  PROT 8.9*  ALBUMIN 3.1*   No results for input(s): LIPASE, AMYLASE in the last 168 hours. No results for input(s): AMMONIA in the last 168 hours. Coagulation Profile: No results for input(s): INR, PROTIME in the last 168 hours. Cardiac Enzymes: No results for input(s): CKTOTAL, CKMB, CKMBINDEX, TROPONINI in the last 168 hours. BNP (last 3 results) No results for input(s): PROBNP in the last 8760 hours. HbA1C: No results for input(s): HGBA1C in the last 72 hours. CBG: No results for input(s): GLUCAP in the last 168 hours. Lipid Profile: No results for input(s): CHOL, HDL, LDLCALC, TRIG, CHOLHDL, LDLDIRECT in the last 72 hours. Thyroid Function Tests: No results for input(s): TSH, T4TOTAL, FREET4, T3FREE, THYROIDAB in the last 72  hours. Anemia Panel: No results for input(s): VITAMINB12, FOLATE, FERRITIN, TIBC, IRON, RETICCTPCT in the last 72 hours. Urine analysis: No results found for: COLORURINE, APPEARANCEUR, LABSPEC, PHURINE, GLUCOSEU, HGBUR, BILIRUBINUR, KETONESUR, PROTEINUR, UROBILINOGEN, NITRITE, LEUKOCYTESUR  Radiological Exams on Admission: CT Soft Tissue Neck W Contrast  Result Date: 01/26/2020 CLINICAL DATA:  Lymphadenopathy EXAM: CT NECK WITH CONTRAST TECHNIQUE: Multidetector CT imaging of the neck was performed using the standard protocol following the bolus administration of intravenous contrast. CONTRAST:  OMNIPAQUE IOHEXOL 300 MG/ML  SOLN COMPARISON:  01/19/2020 FINDINGS: Pharynx and larynx: Significant enlargement of adenoids and palatine tonsils. Lingual tonsils are also prominent. No evidence of peritonsillar abscess. There is narrowing of the nasopharyngeal and oropharyngeal airway. Remainder of the pharynx is unremarkable with patent airway. Salivary glands: Unremarkable. Thyroid: Normal Lymph nodes: Adenopathy is present. Largest node on the right at level 2/3 measures 3.3 cm (previously 3.1 cm). Largest node on the left at level 2 measures 2.6 cm (previously 2.2 cm). Vascular: Major neck vessels are patent. There is compression of bilateral internal jugular veins coursing through the adenopathy. Limited intracranial: No abnormal enhancement Visualized orbits: Unremarkable. Mastoids and visualized paranasal sinuses: Paranasal sinus inflammatory changes including a small right maxillary sinus air-fluid level. Mastoid air cells are clear. Skeleton: Unremarkable. Upper chest: Included upper lungs are clear. Other: None. IMPRESSION: Persistent enlargement of lymphoid tissues of the nasopharynx and oropharynx with some progression resulting in airway narrowing. There remains no evidence of abscess formation. Cervical adenopathy including bulky level 2/3 nodes, which have slightly increased in size. Increased  paranasal sinus inflammatory changes including a right maxillary sinus air-fluid level. This a nonspecific finding that can be seen setting of acute sinusitis. Electronically Signed   By: Guadlupe Spanish M.D.   On: 01/26/2020 16:03    EKG: Independently reviewed. No EKG  Assessment/Plan Active Problems:   Cervical lymphadenopathy   Laryngeal edema  (please populate well all problems here in Problem List. (For example, if patient is on BP meds at home and you resume or decide to hold them, it is a problem that needs to be her. Same for CAD, COPD, HLD and so on)   1. Tonsilitis with laryngeal swelling - patient with monotest positive 3/9. Now with leukocytosis and exudative tonsilitis. Marked swelling with interference of swallow. No respiratory impairment. Given IV solumedrol and Zosyn at [facility] Plan Med-surg admit  Continue Zosyn  Solumedrol 40 mg q 8  IV hydration  Up ad lib, full liquid diet.    DVT prophylaxis: lovenox (Lovenox/Heparin/SCD's/anticoagulated/None (if comfort care) Code Status: full code (Full/Partial (specify details) Family Communication: left msg on Mothers phone (Specify name, relationship. Do not write "discussed with patient". Specify tel # if discussed over the phone)  Disposition Plan: home when stable 24-48 hours (specify when and where you expect patient to be discharged) Consults called: ENT-Dr. Donell Beers aware and to be called if her symptoms don't improve with treatment (with names) Admission status: observation. (inpatient / obs / tele / medical floor / SDU)   Illene Regulus MD [facility] Pager [phone]  If 7PM-7AM, please contact night-coverage [email or web address] Password [facility]  01/26/2020, 8:05 PM

## 2020-01-26 NOTE — ED Notes (Signed)
Pt states she has been all stuffed up and unable to blow nose , dues to nothing coming out , pt has green drainage from nose, throat is cherry red , pt has hot potato voice  With nasal congestion

## 2020-01-27 DIAGNOSIS — B2790 Infectious mononucleosis, unspecified without complication: Secondary | ICD-10-CM | POA: Diagnosis not present

## 2020-01-27 DIAGNOSIS — R1310 Dysphagia, unspecified: Secondary | ICD-10-CM | POA: Diagnosis not present

## 2020-01-27 DIAGNOSIS — G241 Genetic torsion dystonia: Secondary | ICD-10-CM | POA: Diagnosis not present

## 2020-01-27 DIAGNOSIS — R590 Localized enlarged lymph nodes: Secondary | ICD-10-CM | POA: Diagnosis not present

## 2020-01-27 LAB — HIV ANTIBODY (ROUTINE TESTING W REFLEX): HIV Screen 4th Generation wRfx: NONREACTIVE

## 2020-01-27 MED ORDER — DEXTROSE-NACL 5-0.45 % IV SOLN: INTRAVENOUS | Status: AC

## 2020-01-27 NOTE — Consult Note (Signed)
Reason for Consult:mono Referring Physician: hospital  Martha Bray is an 24 y.o. female.  HPI: she has a history of about 1 week of severe sore throat.  She says she has had mono on March 9.  She had a negative strep test but a positive mono test.  She is not had this problem previously.  She has been having fever.  She is very miserable with the throat soreness.  She has been seen in the ER several times.  1 time give her short course of steroids.  She was admitted now with persistent symptoms of difficulty swallowing and sore throat.  Massive adenopathy bilaterally.  CT scan showed adenopathy but no abscess.  She been treated with Solu-Medrol and antibiotics.  She does not have any stridor or difficulty breathing.  She has not had any of this symptom.  She does have nasal obstruction.  Past Medical History:  Diagnosis Date  . Dermatitis 11/14/2016  . DOPA responsive dystonia   . Dystonia    doparesponsive dystonia    Past Surgical History:  Procedure Laterality Date  . WISDOM TOOTH EXTRACTION  03/2018    History reviewed. No pertinent family history.  Social History:  reports that she has never smoked. She has never used smokeless tobacco. She reports that she does not drink alcohol or use drugs.  Allergies: No Known Allergies  Medications: I have reviewed the patient's current medications.  Results for orders placed or performed during the hospital encounter of 01/26/20 (from the past 48 hour(s))  Comprehensive metabolic panel     Status: Abnormal   Collection Time: 01/26/20  1:13 PM  Result Value Ref Range   Sodium 137 135 - 145 mmol/L   Potassium 3.5 3.5 - 5.1 mmol/L   Chloride 100 98 - 111 mmol/L   CO2 24 22 - 32 mmol/L   Glucose, Bld 140 (H) 70 - 99 mg/dL    Comment: Glucose reference range applies only to samples taken after fasting for at least 8 hours.   BUN 13 6 - 20 mg/dL   Creatinine, Ser 6.21 0.44 - 1.00 mg/dL   Calcium 8.7 (L) 8.9 - 10.3 mg/dL   Total Protein  8.9 (H) 6.5 - 8.1 g/dL   Albumin 3.1 (L) 3.5 - 5.0 g/dL   AST 41 15 - 41 U/L   ALT 24 0 - 44 U/L   Alkaline Phosphatase 140 (H) 38 - 126 U/L   Total Bilirubin 0.7 0.3 - 1.2 mg/dL   GFR calc non Af Amer >60 >60 mL/min   GFR calc Af Amer >60 >60 mL/min   Anion gap 13 5 - 15    Comment: Performed at [facility], [address] [place], [place] [address]  CBC with Differential     Status: Abnormal   Collection Time: 01/26/20  1:13 PM  Result Value Ref Range   WBC 23.6 (H) 4.0 - 10.5 K/uL   RBC 5.20 (H) 3.87 - 5.11 MIL/uL   Hemoglobin 12.5 12.0 - 15.0 g/dL   HCT 78.4 69.6 - 29.5 %   MCV 78.5 (L) 80.0 - 100.0 fL   MCH 24.0 (L) 26.0 - 34.0 pg   MCHC 30.6 30.0 - 36.0 g/dL   RDW 28.4 13.2 - 44.0 %   Platelets 203 150 - 400 K/uL   nRBC 0.0 0.0 - 0.2 %   Neutrophils Relative % 51 %   Neutro Abs 12.0 (H) 1.7 - 7.7 K/uL   Lymphocytes Relative 36 %  Lymphs Abs 8.6 (H) 0.7 - 4.0 K/uL   Monocytes Relative 10 %   Monocytes Absolute 2.3 (H) 0.1 - 1.0 K/uL   Eosinophils Relative 0 %   Eosinophils Absolute 0.1 0.0 - 0.5 K/uL   Basophils Relative 0 %   Basophils Absolute 0.0 0.0 - 0.1 K/uL   Immature Granulocytes 3 %   Abs Immature Granulocytes 0.61 (H) 0.00 - 0.07 K/uL    Comment: Performed at [facility], [address] [place], [place] [address]  Group A Strep by PCR     Status: None   Collection Time: 01/26/20  1:13 PM   Specimen: Throat; Sterile Swab  Result Value Ref Range   Group A Strep by PCR NOT DETECTED NOT DETECTED    Comment: Performed at [facility], [address] [place], [place] [address]  Respiratory Panel by RT PCR (Flu A&B, Covid) - Throat     Status: None   Collection Time: 01/26/20  1:20 PM   Specimen: Throat  Result Value Ref Range   SARS Coronavirus 2 by RT PCR NEGATIVE NEGATIVE    Comment: (NOTE) SARS-CoV-2 target nucleic acids are NOT DETECTED. The SARS-CoV-2 RNA is generally detectable in upper respiratoy specimens during  the acute phase of infection. The lowest concentration of SARS-CoV-2 viral copies this assay can detect is 131 copies/mL. A negative result does not preclude SARS-Cov-2 infection and should not be used as the sole basis for treatment or other patient management decisions. A negative result may occur with  improper specimen collection/handling, submission of specimen other than nasopharyngeal swab, presence of viral mutation(s) within the areas targeted by this assay, and inadequate number of viral copies (<131 copies/mL). A negative result must be combined with clinical observations, patient history, and epidemiological information. The expected result is Negative. Fact Sheet for Patients:  https://www.moore.com/ Fact Sheet for Healthcare Providers:  https://www.young.biz/ This test is not yet ap proved or cleared by the [place] FDA and  has been authorized for detection and/or diagnosis of SARS-CoV-2 by FDA under an Emergency Use Authorization (EUA). This EUA will remain  in effect (meaning this test can be used) for the duration of the COVID-19 declaration under Section 564(b)(1) of the Act, 21 U.S.C. section 360bbb-3(b)(1), unless the authorization is terminated or revoked sooner.    Influenza A by PCR NEGATIVE NEGATIVE   Influenza B by PCR NEGATIVE NEGATIVE    Comment: (NOTE) The Xpert Xpress SARS-CoV-2/FLU/RSV assay is intended as an aid in  the diagnosis of influenza from Nasopharyngeal swab specimens and  should not be used as a sole basis for treatment. Nasal washings and  aspirates are unacceptable for Xpert Xpress SARS-CoV-2/FLU/RSV  testing. Fact Sheet for Patients: https://www.moore.com/ Fact Sheet for Healthcare Providers: https://www.young.biz/ This test is not yet approved or cleared by the [place] FDA and  has been authorized for detection and/or diagnosis of SARS-CoV-2 by  FDA  under an Emergency Use Authorization (EUA). This EUA will remain  in effect (meaning this test can be used) for the duration of the  Covid-19 declaration under Section 564(b)(1) of the Act, 21  U.S.C. section 360bbb-3(b)(1), unless the authorization is  terminated or revoked. Performed at [facility], [address] [place], [place] [address]   HIV Antibody (routine testing w rflx)     Status: None   Collection Time: 01/26/20  8:23 PM  Result Value Ref Range   HIV Screen 4th Generation wRfx NON  REACTIVE NON REACTIVE    Comment: Performed at [facility], [place] [address], [place], [place] [address]    CT Soft Tissue Neck W Contrast  Result Date: 01/26/2020 CLINICAL DATA:  Lymphadenopathy EXAM: CT NECK WITH CONTRAST TECHNIQUE: Multidetector CT imaging of the neck was performed using the standard protocol following the bolus administration of intravenous contrast. CONTRAST:  150mL OMNIPAQUE IOHEXOL 300 MG/ML  SOLN COMPARISON:  01/19/2020 FINDINGS: Pharynx and larynx: Significant enlargement of adenoids and palatine tonsils. Lingual tonsils are also prominent. No evidence of peritonsillar abscess. There is narrowing of the nasopharyngeal and oropharyngeal airway. Remainder of the pharynx is unremarkable with patent airway. Salivary glands: Unremarkable. Thyroid: Normal Lymph nodes: Adenopathy is present. Largest node on the right at level 2/3 measures 3.3 cm (previously 3.1 cm). Largest node on the left at level 2 measures 2.6 cm (previously 2.2 cm). Vascular: Major neck vessels are patent. There is compression of bilateral internal jugular veins coursing through the adenopathy. Limited intracranial: No abnormal enhancement Visualized orbits: Unremarkable. Mastoids and visualized paranasal sinuses: Paranasal sinus inflammatory changes including a small right maxillary sinus air-fluid level. Mastoid air cells are clear. Skeleton: Unremarkable. Upper chest: Included upper lungs are  clear. Other: None. IMPRESSION: Persistent enlargement of lymphoid tissues of the nasopharynx and oropharynx with some progression resulting in airway narrowing. There remains no evidence of abscess formation. Cervical adenopathy including bulky level 2/3 nodes, which have slightly increased in size. Increased paranasal sinus inflammatory changes including a right maxillary sinus air-fluid level. This a nonspecific finding that can be seen setting of acute sinusitis. Electronically Signed   By: Macy Mis M.D.   On: 01/26/2020 16:03    Review of Systems Blood pressure 124/82, pulse 89, temperature 97.7 F (36.5 C), temperature source Oral, resp. rate 18, height 5\' 3"  (1.6 m), weight 91.2 kg, SpO2 95 %. Physical Exam  Constitutional: She appears well-developed.  HENT:  Hot potato voice and exudate and ulcer on the tonsills bilaterally. They are +4 B. She is no trismus. Tongue normal size. No oral ulcers. Neck with huge adenopathy in the level 1,2,3,5 bilaterally   Eyes: Pupils are equal, round, and reactive to light. Conjunctivae are normal.    Assessment/Plan: Mononucleosis pharyngitis and cervical adenopathy-I do not have any additional recommendations other than the steroids and the antibiotics.  Acyclovir or Valtrex has not been shown to be specifically effective other than some symptomatic relief.  She does have significantly larger adenopathy then typical and its in the anterior chain rather posterior which is also somewhat atypical.  If she continues to maintain this is a further evaluation with possible needle biopsy would be appropriate but not right now.  With no airway problems I would not recommend acute tonsillectomy as this will only serve to make her worse. Might consider HIV and toxoplasmosis testing  Melissa Montane 01/27/2020, 4:37 PM

## 2020-01-27 NOTE — Progress Notes (Addendum)
PROGRESS NOTE  Martha Bray FUX:[id number] DOB: 03-04-1996 DOA: 01/26/2020 PCP: Debbrah Alar, NP   LOS: 1 day   Brief narrative:  As per HPI,  Martha Bray is a 24 y.o. female with medical history significant of dopa responsive dystonia presented to emergency department with chief complaint of progressively worseningsore throat x 1 week.Patient had a telemedicine visit with PCP earlier today and was sent to the emergency department for further evaluation. Patient was seen in the ED on 01/19/2020 and tested positive for mono, negative for strep. She had CT scan soft tissue that showed enlargement of nasopharyngeal and oropharyngeal lymphoid tissues, cervical adenopathy, no abscess. Discharged home with Advil and Zofran. Patient states she followed up with her PCP 2 days later because she was feeling worse. She was given 5 days of p.o. prednisone. She states she took that as prescribed however continued to get worse.patient had presented to the ED with increasing difficulty swallowing sore throat and difficulty taking oral intake. Patient was hemodynamically stable in ED. Lab remarkable for leukocytosis with normal diff, glucose  140, CT neck with adenopathy, no abscess.  Patient was given IV  Solumedrol in ED. No stridor or respiratory compromise.   Assessment/Plan:  Bilateral cervical lymphadenopathy Infectious mononucleosis. Bilateral exudative tonsillitis.  Bilateral gross exudative tonsillitis with significant cervical lymphadenopathy-odynophagia.  Drinking some water.  Patient with monotest positive 3/9. Now with leukocytosis and exudative tonsilitis.  Muffled voice but no stridor.  Continue IV solumedrol and Zosyn .  Not much of improvement since yesterday.  Will speak to ENT today.  Patient might need to be seen by ENT.  Will try clears if possible.  Continue IV hydration.  Leukocytosis noted but patient is also on steroids.  Covid and flu was negative.  Group A strep was  negative as well.  Dopa responsive dystonia.  Patient takes Sinemet.  We will continue while in hospital.  Will get speech and swallow evaluation as well.  Baseline, patient does not have good motor skills and has intentional tremors.  With neurology as outpatient.  VTE Prophylaxis: Lovenox subcu  Code Status:  Full code  Family Communication: I spoke with the patient's mother on the phone and updated her about the clinical condition of the patient.  Disposition Plan:  . Patient is from home . Likely disposition to home in 1 to 2 days. . Barriers to discharge: Significant tonsillar swelling, dysphagia, on IV antibiotics, muffled voice.  ENT evaluation.  Addendum:  01/27/2020 11:29 AM  Spoke with Dr. Janace Hoard about the patient's condition.  He stated that current treatment is adequate and will have to observe to see how she does.  He however stated that he will also see her for consultation.  Consultants:  ENT Dr. Janace Hoard notified  Procedures:  None  Antibiotics:  . Zosyn IV  Anti-infectives (From admission, onward)   Start     Dose/Rate Route Frequency Ordered Stop   01/27/20 0000  piperacillin-tazobactam (ZOSYN) IVPB 3.375 g     3.375 g 12.5 mL/hr over 240 Minutes Intravenous Every 8 hours 01/26/20 2036 01/29/20 2359   01/26/20 2200  piperacillin-tazobactam (ZOSYN) IVPB 3.375 g  Status:  Discontinued     3.375 g 100 mL/hr over 30 Minutes Intravenous Every 8 hours 01/26/20 2034 01/26/20 2036   01/26/20 1500  piperacillin-tazobactam (ZOSYN) IVPB 3.375 g     3.375 g 100 mL/hr over 30 Minutes Intravenous  Once 01/26/20 1458 01/26/20 1811       Subjective: Today, patient was seen  and examined at bedside.  Patient complains of  sore throat, muffled voice difficulty swallowing.  Denies difficulty breathing.  Objective: Vitals:   01/27/20 0558 01/27/20 0930  BP: 126/89 125/84  Pulse: 96 74  Resp: 18 18  Temp: 97.8 F (36.6 C) 97.9 F (36.6 C)  SpO2: 96%      Intake/Output Summary (Last 24 hours) at 01/27/2020 1039 Last data filed at 01/27/2020 0300 Gross per 24 hour  Intake 496.34 ml  Output --  Net 496.34 ml   Filed Weights   01/26/20 1248  Weight: 91.2 kg   Body mass index is 35.62 kg/m.   Physical Exam: GENERAL: Patient is alert awake and oriented. Not in obvious distress.  Morbidly obese muffled voice. HENT: No scleral pallor or icterus. Pupils equally reactive to light. Oral mucosa is moist.  Submandibular cervical lymphadenopathy.  Bilateral carotids tonsillar enlargement with exudates NECK lateral process cervical lymphadenopathy. CHEST: Clear to auscultation. No crackles or wheezes.  Diminished breath sounds bilaterally. CVS: S1 and S2 heard, no murmur. Regular rate and rhythm.  ABDOMEN: Soft, non-tender, bowel sounds are present. EXTREMITIES: No edema. CNS: Cranial nerves are intact. No focal motor deficits. SKIN: warm and dry without rashes.  Data Review: I have personally reviewed the following laboratory data and studies,  CBC: Recent Labs  Lab 01/26/20 1313  WBC 23.6*  NEUTROABS 12.0*  HGB 12.5  HCT 40.8  MCV 78.5*  PLT 203   Basic Metabolic Panel: Recent Labs  Lab 01/26/20 1313  NA 137  K 3.5  CL 100  CO2 24  GLUCOSE 140*  BUN 13  CREATININE 0.82  CALCIUM 8.7*   Liver Function Tests: Recent Labs  Lab 01/26/20 1313  AST 41  ALT 24  ALKPHOS 140*  BILITOT 0.7  PROT 8.9*  ALBUMIN 3.1*   No results for input(s): LIPASE, AMYLASE in the last 168 hours. No results for input(s): AMMONIA in the last 168 hours. Cardiac Enzymes: No results for input(s): CKTOTAL, CKMB, CKMBINDEX, TROPONINI in the last 168 hours. BNP (last 3 results) No results for input(s): BNP in the last 8760 hours.  ProBNP (last 3 results) No results for input(s): PROBNP in the last 8760 hours.  CBG: No results for input(s): GLUCAP in the last 168 hours. Recent Results (from the past 240 hour(s))  Group A Strep by PCR      Status: None   Collection Time: 01/19/20 10:54 AM   Specimen: Throat; Sterile Swab  Result Value Ref Range Status   Group A Strep by PCR NOT DETECTED NOT DETECTED Final    Comment: Performed at [facility], [address] [place], [place] [address]  Group A Strep by PCR     Status: None   Collection Time: 01/26/20  1:13 PM   Specimen: Throat; Sterile Swab  Result Value Ref Range Status   Group A Strep by PCR NOT DETECTED NOT DETECTED Final    Comment: Performed at [facility], [address] [place], [place] [address]  Respiratory Panel by RT PCR (Flu A&B, Covid) - Throat     Status: None   Collection Time: 01/26/20  1:20 PM   Specimen: Throat  Result Value Ref Range Status   SARS Coronavirus 2 by RT PCR NEGATIVE NEGATIVE Final    Comment: (NOTE) SARS-CoV-2 target nucleic acids are NOT DETECTED. The SARS-CoV-2 RNA is generally detectable in upper respiratoy specimens during the acute phase of infection. The lowest concentration of SARS-CoV-2  viral copies this assay can detect is 131 copies/mL. A negative result does not preclude SARS-Cov-2 infection and should not be used as the sole basis for treatment or other patient management decisions. A negative result may occur with  improper specimen collection/handling, submission of specimen other than nasopharyngeal swab, presence of viral mutation(s) within the areas targeted by this assay, and inadequate number of viral copies (<131 copies/mL). A negative result must be combined with clinical observations, patient history, and epidemiological information. The expected result is Negative. Fact Sheet for Patients:  https://www.moore.com/ Fact Sheet for Healthcare Providers:  https://www.young.biz/ This test is not yet ap proved or cleared by the [place] FDA and  has been authorized for detection and/or diagnosis of SARS-CoV-2 by FDA under an Emergency Use  Authorization (EUA). This EUA will remain  in effect (meaning this test can be used) for the duration of the COVID-19 declaration under Section 564(b)(1) of the Act, 21 U.S.C. section 360bbb-3(b)(1), unless the authorization is terminated or revoked sooner.    Influenza A by PCR NEGATIVE NEGATIVE Final   Influenza B by PCR NEGATIVE NEGATIVE Final    Comment: (NOTE) The Xpert Xpress SARS-CoV-2/FLU/RSV assay is intended as an aid in  the diagnosis of influenza from Nasopharyngeal swab specimens and  should not be used as a sole basis for treatment. Nasal washings and  aspirates are unacceptable for Xpert Xpress SARS-CoV-2/FLU/RSV  testing. Fact Sheet for Patients: https://www.moore.com/ Fact Sheet for Healthcare Providers: https://www.young.biz/ This test is not yet approved or cleared by the [place] FDA and  has been authorized for detection and/or diagnosis of SARS-CoV-2 by  FDA under an Emergency Use Authorization (EUA). This EUA will remain  in effect (meaning this test can be used) for the duration of the  Covid-19 declaration under Section 564(b)(1) of the Act, 21  U.S.C. section 360bbb-3(b)(1), unless the authorization is  terminated or revoked. Performed at [facility], [address] [place], [place] [address]      Studies: CT Soft Tissue Neck W Contrast  Result Date: 01/26/2020 CLINICAL DATA:  Lymphadenopathy EXAM: CT NECK WITH CONTRAST TECHNIQUE: Multidetector CT imaging of the neck was performed using the standard protocol following the bolus administration of intravenous contrast. CONTRAST:  OMNIPAQUE IOHEXOL 300 MG/ML  SOLN COMPARISON:  01/19/2020 FINDINGS: Pharynx and larynx: Significant enlargement of adenoids and palatine tonsils. Lingual tonsils are also prominent. No evidence of peritonsillar abscess. There is narrowing of the nasopharyngeal and oropharyngeal airway. Remainder of the pharynx is  unremarkable with patent airway. Salivary glands: Unremarkable. Thyroid: Normal Lymph nodes: Adenopathy is present. Largest node on the right at level 2/3 measures 3.3 cm (previously 3.1 cm). Largest node on the left at level 2 measures 2.6 cm (previously 2.2 cm). Vascular: Major neck vessels are patent. There is compression of bilateral internal jugular veins coursing through the adenopathy. Limited intracranial: No abnormal enhancement Visualized orbits: Unremarkable. Mastoids and visualized paranasal sinuses: Paranasal sinus inflammatory changes including a small right maxillary sinus air-fluid level. Mastoid air cells are clear. Skeleton: Unremarkable. Upper chest: Included upper lungs are clear. Other: None. IMPRESSION: Persistent enlargement of lymphoid tissues of the nasopharynx and oropharynx with some progression resulting in airway narrowing. There remains no evidence of abscess formation. Cervical adenopathy including bulky level 2/3 nodes, which have slightly increased in size. Increased paranasal sinus inflammatory changes including a right maxillary sinus air-fluid level. This a nonspecific finding that can be seen setting of acute sinusitis. Electronically Signed  By: Guadlupe Spanish M.D.   On: 01/26/2020 16:03      Joycelyn Das, MD  [facility] 01/27/2020

## 2020-01-28 ENCOUNTER — Other Ambulatory Visit: Payer: Self-pay | Admitting: Family

## 2020-01-28 DIAGNOSIS — B2790 Infectious mononucleosis, unspecified without complication: Secondary | ICD-10-CM | POA: Diagnosis not present

## 2020-01-28 DIAGNOSIS — J039 Acute tonsillitis, unspecified: Secondary | ICD-10-CM | POA: Diagnosis not present

## 2020-01-28 DIAGNOSIS — G241 Genetic torsion dystonia: Secondary | ICD-10-CM

## 2020-01-28 DIAGNOSIS — R1310 Dysphagia, unspecified: Secondary | ICD-10-CM

## 2020-01-28 DIAGNOSIS — R590 Localized enlarged lymph nodes: Secondary | ICD-10-CM | POA: Diagnosis not present

## 2020-01-28 HISTORY — DX: Infectious mononucleosis, unspecified without complication: B27.90

## 2020-01-28 LAB — BASIC METABOLIC PANEL
Anion gap: 8 (ref 5–15)
BUN: 12 mg/dL (ref 6–20)
CO2: 24 mmol/L (ref 22–32)
Calcium: 8.9 mg/dL (ref 8.9–10.3)
Chloride: 106 mmol/L (ref 98–111)
Creatinine, Ser: 0.6 mg/dL (ref 0.44–1.00)
GFR calc Af Amer: >60 mL/min (ref >60)
GFR calc non Af Amer: >60 mL/min (ref >60)
Glucose, Bld: 98 mg/dL (ref 70–99)
Potassium: 5.4 mmol/L — ABNORMAL HIGH (ref 3.5–5.1)
Sodium: 138 mmol/L (ref 135–145)

## 2020-01-28 LAB — CBC
HCT: 33.1 % — ABNORMAL LOW (ref 36.0–46.0)
Hemoglobin: 10.3 g/dL — ABNORMAL LOW (ref 12.0–15.0)
MCH: 23.8 pg — ABNORMAL LOW (ref 26.0–34.0)
MCHC: 31.1 g/dL (ref 30.0–36.0)
MCV: 76.6 fL — ABNORMAL LOW (ref 80.0–100.0)
Platelets: 181 10*3/uL (ref 150–400)
RBC: 4.32 MIL/uL (ref 3.87–5.11)
RDW: 14.9 % (ref 11.5–15.5)
WBC: 19.4 10*3/uL — ABNORMAL HIGH (ref 4.0–10.5)
nRBC: 0 % (ref 0.0–0.2)

## 2020-01-28 LAB — PATHOLOGIST SMEAR REVIEW: Path Review: REACTIVE

## 2020-01-28 LAB — MAGNESIUM: Magnesium: 1.9 mg/dL (ref 1.7–2.4)

## 2020-01-28 MED ORDER — OXYMETAZOLINE HCL 0.05 % NA SOLN
1.0000 | Freq: Two times a day (BID) | NASAL | Status: DC
  Administered 2020-01-28 – 2020-01-29 (×3): 1 via NASAL
  Filled 2020-01-28: qty 15

## 2020-01-28 MED ORDER — CARBIDOPA-LEVODOPA ER 25-100 MG PO TBCR
1.0000 | EXTENDED_RELEASE_TABLET | Freq: Every day | ORAL | Status: DC
  Administered 2020-01-28 – 2020-01-29 (×2): 1 via ORAL
  Filled 2020-01-28 (×2): qty 1

## 2020-01-28 MED ORDER — ONDANSETRON 4 MG PO TBDP: 4.0000 mg | ORAL_TABLET | Freq: Three times a day (TID) | ORAL | Status: DC | PRN

## 2020-01-28 MED ORDER — BOOST / RESOURCE BREEZE PO LIQD CUSTOM
1.0000 | Freq: Three times a day (TID) | ORAL | Status: DC
  Administered 2020-01-28 – 2020-01-29 (×3): 1 via ORAL

## 2020-01-28 MED ORDER — FLUTICASONE PROPIONATE 50 MCG/ACT NA SUSP
2.0000 | Freq: Every day | NASAL | Status: DC
  Administered 2020-01-28 – 2020-01-29 (×2): 2 via NASAL
  Filled 2020-01-28: qty 16

## 2020-01-28 MED ORDER — SERTRALINE HCL 50 MG PO TABS
50.0000 mg | ORAL_TABLET | Freq: Every day | ORAL | Status: DC
  Administered 2020-01-28 – 2020-01-29 (×2): 50 mg via ORAL
  Filled 2020-01-28 (×2): qty 1

## 2020-01-28 MED ORDER — HYDROCODONE-ACETAMINOPHEN 5-325 MG PO TABS
1.0000 | ORAL_TABLET | Freq: Four times a day (QID) | ORAL | Status: DC | PRN
  Filled 2020-01-28: qty 1

## 2020-01-28 MED ORDER — ALPRAZOLAM 0.25 MG PO TABS
0.2500 mg | ORAL_TABLET | Freq: Three times a day (TID) | ORAL | Status: DC | PRN
  Administered 2020-01-28: 0.25 mg via ORAL
  Filled 2020-01-28: qty 1

## 2020-01-28 NOTE — Evaluation (Signed)
Clinical/Bedside Swallow Evaluation Patient Details  Name: Martha Bray MRN: [id number] Date of Birth: 08-28-1996  Today's Date: 01/28/2020 Time: SLP Start Time (ACUTE ONLY): 38 SLP Stop Time (ACUTE ONLY): 1545 SLP Time Calculation (min) (ACUTE ONLY): 41 min  Past Medical History:  Past Medical History:  Diagnosis Date  . Dermatitis 11/14/2016  . DOPA responsive dystonia   . Dystonia    doparesponsive dystonia   Past Surgical History:  Past Surgical History:  Procedure Laterality Date  . 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  She has dopa-responsivie dystonia -gene abnormality per medical information, dermatitis, mono +, step -.  Pt with recent ED visit 01/19/2020 with same issue when she was treated and sent home.  Pt also reports tonsillitis in August 2020.  Pt has been on a full liquid diet but today was advanced to mechanical soft diet by Md. Swallow evaluation ordered.   Assessment / Plan / Recommendation Clinical Impression  Patient presents with NO palatal elevation with phonation due to edema.  She otherwise has adequate labial/lingual ROM.  Able to swallow liquids easier than solids.  Suspect liquids easier than solids due to edema in pharynx and lack of palatal closure with swallowing decreasing propulsion to transit po through oropharynx into esophagus. No indication of aspiration with thin, Boost Breeze, tomato soup with saltines and applesauce.  Pt does report issues with swallowing causing her to expectorate her pill earlier today and macaroni and cheese prior to admission.  SLP advised her to avoid acidic, hot and milk based products due to pain and secretions.    Recommend diet as tolerated as pt is aware of her dysphagia and modifies her intake based on her needs.  Also advised her to monitor palatal elevation with phonation - with SLP palpation to palate- gag produced with  minimal movement.  Pt reports issues with dyspnea with solids requiring mastication due to her inability to breath through her nose.    Advised she can take rest breaks chewing as needed with food in oral cavity. Since liquids are easist for pt to consume and her intake is poor due to her dysphgia, recommend Boost Breeze TID.  All education completed, no follow up.    Thanks.Marland Kitchen SLP Visit Diagnosis: Dysphagia, oropharyngeal phase (R13.12)    Aspiration Risk  Mild aspiration risk    Diet Recommendation Thin liquid(as tolerated)   Liquid Administration via: Cup;Straw Medication Administration: Other (Comment)(try suspension or with applesauce if helpful crushed) Compensations: Slow rate;Small sips/bites;Follow solids with liquid Postural Changes: Seated upright at 90 degrees;Remain upright for at least 30 minutes after po intake    Other  Recommendations Oral Care Recommendations: Oral care BID   Follow up Recommendations None      Frequency and Duration      n/a      Prognosis    good    Swallow Study   General HPI: 24 yo female adm to [facility] 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  She has dopa-responsivie dystonia -gene abnormality per medical information, dermatitis, mono +, step -.  Pt with recent ED visit 01/19/2020 with same issue when she was treated and sent home.  Pt also reports tonsillitis in August 2020.  Pt has been on a full liquid diet but today was advanced to mechanical soft diet by Md. Swallow evaluation ordered. Type of Study: Bedside Swallow Evaluation  Diet Prior to this Study: Dysphagia 3 (soft);Thin liquids Temperature Spikes Noted: No Respiratory Status: Room air History of Recent Intubation: No Behavior/Cognition: Alert;Cooperative Oral Cavity Assessment: Erythema;Other (comment)(exudate on tonsils) Oral Care Completed by SLP: No Oral Cavity - Dentition: Adequate natural dentition Vision: Functional for self-feeding Self-Feeding  Abilities: Able to feed self Patient Positioning: Upright in bed Baseline Vocal Quality: Other (comment)(hypernasal) Volitional Cough: Other (Comment)(did not cue to product secondary to discomfort)    Oral/Motor/Sensory Function Overall Oral Motor/Sensory Function: Within functional limits   Ice Chips Ice chips: Not tested   Thin Liquid Thin Liquid: Impaired Presentation: Straw;Cup Pharyngeal  Phase Impairments: Multiple swallows    Nectar Thick Nectar Thick Liquid: Not tested   Honey Thick Honey Thick Liquid: Not tested   Puree Puree: Within functional limits Presentation: Self Fed;Spoon Other Comments: 1/2 tsp of applesauce   Solid     Solid: Not tested      Chales Abrahams 01/28/2020,4:38 PM   Rolena Infante, MS [facility] SLP Acute Rehab Services Office [phone]

## 2020-01-28 NOTE — Evaluation (Signed)
{  EVAL NOTES:[id number] Cancellation Note  Patient Details Name: Adelaine Roppolo MRN: [id number] DOB: 1996/03/10   Cancelled treatment:       Reason Eval/Treat Not Completed: Other (comment)(pt is s/p ENT visit and is on steroids, spoke to [facility] RN who inquired it pt feels better - to which pt confirmed, Will be by later to see pt for evaluation today as she may chronic dysphagia due to her dystonia, RN informed and agreeable.)   Chales Abrahams 01/28/2020, 10:19 AM  Rolena Infante, MS [facility] SLP Acute Rehab Services Office [phone]

## 2020-01-28 NOTE — Progress Notes (Addendum)
PROGRESS NOTE  Martha Bray PYP:[id number] DOB: 05-05-1996 DOA: 01/26/2020 PCP: Sandford Craze, NP   LOS: 1 day   Brief narrative:  As per HPI, Martha Bray is a 24 y.o. female with medical history significant of dopa responsive dystonia presented to emergency department with chief complaint of progressively worseningsore throat x 1 week.Patient had a telemedicine visit with PCP earlier today and was sent to the emergency department for further evaluation. Patient was seen in the ED on 01/19/2020 and tested positive for mono, negative for strep. She had CT scan soft tissue that showed enlargement of nasopharyngeal and oropharyngeal lymphoid tissues, cervical adenopathy, no abscess. Discharged home with Advil and Zofran. Patient states she followed up with her PCP 2 days later because she was feeling worse. She was given 5 days of p.o. prednisone. She states she took that as prescribed however continued to get worse.patient had presented to the ED with increasing difficulty swallowing sore throat and difficulty taking oral intake. Patient was hemodynamically stable in ED. Lab remarkable for leukocytosis with normal diff, glucose  140, CT neck with adenopathy, no abscess.  Patient was given IV  Solumedrol in ED. No stridor or respiratory compromise.   Assessment/Plan:  Principal Problem:   Exudative tonsillitis Active Problems:   DOPA responsive dystonia   Cervical lymphadenopathy   Infectious mononucleosis   Dysphagia   Bilateral gross exudative tonsillitis with significant cervical lymphadenopathy-odynophagia.   Patient does have significant throat swelling.  Will try some soft diet today.  Seen by ENT yesterday recommend to continue steroids and antibiotic.  Patient was  monotest positive 3/9. Now with leukocytosis and exudative tonsilitis.  Muffled voice but no stridor.  Continue IV solumedrol and Zosyn .  Continue IV hydration.  Leukocytosis noted but patient is also on steroids,  trending down.  Covid and flu was negative.  Group A strep was negative as well.  Dopa responsive dystonia.  Continue Sinemet.  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  Follows up with neurology as outpatient  Borderline hyperkalemia.  Could be secondary to steroids.  We will close monitor..  VTE Prophylaxis: Lovenox subcu  Code Status:  Full code  Family Communication: None today.  I spoke with the patient's mother on the phone yesterday  Disposition Plan:  . Patient is from home . Likely disposition to home in 1 to 2 days. . Barriers to discharge: Significant tonsillar swelling, dysphagia, on IV antibiotics, muffled voice, advance diet today.    Consultants: ENT Dr. Jearld Fenton   Procedures:  None  Antibiotics:  . Zosyn IV  Anti-infectives (From admission, onward)   Start     Dose/Rate Route Frequency Ordered Stop   01/27/20 0000  piperacillin-tazobactam (ZOSYN) IVPB 3.375 g     3.375 g 12.5 mL/hr over 240 Minutes Intravenous Every 8 hours 01/26/20 2036 01/29/20 2359   01/26/20 2200  piperacillin-tazobactam (ZOSYN) IVPB 3.375 g  Status:  Discontinued     3.375 g 100 mL/hr over 30 Minutes Intravenous Every 8 hours 01/26/20 2034 01/26/20 2036   01/26/20 1500  piperacillin-tazobactam (ZOSYN) IVPB 3.375 g     3.375 g 100 mL/hr over 30 Minutes Intravenous  Once 01/26/20 1458 01/26/20 1811      Subjective: Today, patient was seen and examined at bedside.  Still complains of sore throat muffled voice.  Was okay with liquid diet.  Objective: Vitals:   01/28/20 0150 01/28/20 0600  BP: 119/81 (!) 128/95  Pulse: 81 79  Resp: 20 18  Temp: 98.7 F (37.1 C) 98.6 F (37 C)  SpO2: 97% 96%    Intake/Output Summary (Last 24 hours) at 01/28/2020 0916 Last data filed at 01/27/2020 1800 Gross per 24 hour  Intake 464.96 ml  Output --  Net 464.96 ml   Filed Weights   01/26/20 1248  Weight: 91.2 kg   Body  mass index is 35.62 kg/m.   Physical Exam: GENERAL: Patient is alert awake and oriented. Not in obvious distress.  Morbidly obese, muffled voice. HENT: No scleral pallor or icterus. Pupils equally reactive to light. Oral mucosa is moist. Submandibular cervical lymphadenopathy.  Bilateral carotids tonsillar enlargement with exudates NECK submandibular bilateral lymphadenopathy. CHEST: Clear to auscultation. No crackles or wheezes.  Diminished breath sounds bilaterally. CVS: S1 and S2 heard, no murmur. Regular rate and rhythm.  ABDOMEN: Soft, non-tender, bowel sounds are present. EXTREMITIES: No edema. CNS: Cranial nerves are intact. No focal motor deficits. SKIN: warm and dry without rashes.  Data Review: I have personally reviewed the following laboratory data and studies,  CBC: Recent Labs  Lab 01/26/20 1313 01/28/20 0534  WBC 23.6* 19.4*  NEUTROABS 12.0*  --   HGB 12.5 10.3*  HCT 40.8 33.1*  MCV 78.5* 76.6*  PLT 203 181   Basic Metabolic Panel: Recent Labs  Lab 01/26/20 1313 01/28/20 0534  NA 137 138  K 3.5 5.4*  CL 100 106  CO2 24 24  GLUCOSE 140* 98  BUN 13 12  CREATININE 0.82 0.60  CALCIUM 8.7* 8.9  MG  --  1.9   Liver Function Tests: Recent Labs  Lab 01/26/20 1313  AST 41  ALT 24  ALKPHOS 140*  BILITOT 0.7  PROT 8.9*  ALBUMIN 3.1*   No results for input(s): LIPASE, AMYLASE in the last 168 hours. No results for input(s): AMMONIA in the last 168 hours. Cardiac Enzymes: No results for input(s): CKTOTAL, CKMB, CKMBINDEX, TROPONINI in the last 168 hours. BNP (last 3 results) No results for input(s): BNP in the last 8760 hours.  ProBNP (last 3 results) No results for input(s): PROBNP in the last 8760 hours.  CBG: No results for input(s): GLUCAP in the last 168 hours. Recent Results (from the past 240 hour(s))  Group A Strep by PCR     Status: None   Collection Time: 01/19/20 10:54 AM   Specimen: Throat; Sterile Swab  Result Value Ref Range Status    Group A Strep by PCR NOT DETECTED NOT DETECTED Final    Comment: Performed at [facility], [address] [place], [place] [address]  Group A Strep by PCR     Status: None   Collection Time: 01/26/20  1:13 PM   Specimen: Throat; Sterile Swab  Result Value Ref Range Status   Group A Strep by PCR NOT DETECTED NOT DETECTED Final    Comment: Performed at [facility], [address] [place], [place] [address]  Respiratory Panel by RT PCR (Flu A&B, Covid) - Throat     Status: None   Collection Time: 01/26/20  1:20 PM   Specimen: Throat  Result Value Ref Range Status   SARS Coronavirus 2 by RT PCR NEGATIVE NEGATIVE Final    Comment: (NOTE) SARS-CoV-2 target nucleic acids are NOT DETECTED. The SARS-CoV-2 RNA is generally detectable in upper respiratoy specimens during the acute phase of infection. The lowest concentration of SARS-CoV-2 viral copies this assay can detect is 131 copies/mL.  A negative result does not preclude SARS-Cov-2 infection and should not be used as the sole basis for treatment or other patient management decisions. A negative result may occur with  improper specimen collection/handling, submission of specimen other than nasopharyngeal swab, presence of viral mutation(s) within the areas targeted by this assay, and inadequate number of viral copies (<131 copies/mL). A negative result must be combined with clinical observations, patient history, and epidemiological information. The expected result is Negative. Fact Sheet for Patients:  https://www.moore.com/ Fact Sheet for Healthcare Providers:  https://www.young.biz/ This test is not yet ap proved or cleared by the [place] FDA and  has been authorized for detection and/or diagnosis of SARS-CoV-2 by FDA under an Emergency Use Authorization (EUA). This EUA will remain  in effect (meaning this test can be used) for the duration of the COVID-19  declaration under Section 564(b)(1) of the Act, 21 U.S.C. section 360bbb-3(b)(1), unless the authorization is terminated or revoked sooner.    Influenza A by PCR NEGATIVE NEGATIVE Final   Influenza B by PCR NEGATIVE NEGATIVE Final    Comment: (NOTE) The Xpert Xpress SARS-CoV-2/FLU/RSV assay is intended as an aid in  the diagnosis of influenza from Nasopharyngeal swab specimens and  should not be used as a sole basis for treatment. Nasal washings and  aspirates are unacceptable for Xpert Xpress SARS-CoV-2/FLU/RSV  testing. Fact Sheet for Patients: https://www.moore.com/ Fact Sheet for Healthcare Providers: https://www.young.biz/ This test is not yet approved or cleared by the [place] FDA and  has been authorized for detection and/or diagnosis of SARS-CoV-2 by  FDA under an Emergency Use Authorization (EUA). This EUA will remain  in effect (meaning this test can be used) for the duration of the  Covid-19 declaration under Section 564(b)(1) of the Act, 21  U.S.C. section 360bbb-3(b)(1), unless the authorization is  terminated or revoked. Performed at [facility], [address] [place], [place] [address]      Studies: CT Soft Tissue Neck W Contrast  Result Date: 01/26/2020 CLINICAL DATA:  Lymphadenopathy EXAM: CT NECK WITH CONTRAST TECHNIQUE: Multidetector CT imaging of the neck was performed using the standard protocol following the bolus administration of intravenous contrast. CONTRAST:  OMNIPAQUE IOHEXOL 300 MG/ML  SOLN COMPARISON:  01/19/2020 FINDINGS: Pharynx and larynx: Significant enlargement of adenoids and palatine tonsils. Lingual tonsils are also prominent. No evidence of peritonsillar abscess. There is narrowing of the nasopharyngeal and oropharyngeal airway. Remainder of the pharynx is unremarkable with patent airway. Salivary glands: Unremarkable. Thyroid: Normal Lymph nodes: Adenopathy is present. Largest node  on the right at level 2/3 measures 3.3 cm (previously 3.1 cm). Largest node on the left at level 2 measures 2.6 cm (previously 2.2 cm). Vascular: Major neck vessels are patent. There is compression of bilateral internal jugular veins coursing through the adenopathy. Limited intracranial: No abnormal enhancement Visualized orbits: Unremarkable. Mastoids and visualized paranasal sinuses: Paranasal sinus inflammatory changes including a small right maxillary sinus air-fluid level. Mastoid air cells are clear. Skeleton: Unremarkable. Upper chest: Included upper lungs are clear. Other: None. IMPRESSION: Persistent enlargement of lymphoid tissues of the nasopharynx and oropharynx with some progression resulting in airway narrowing. There remains no evidence of abscess formation. Cervical adenopathy including bulky level 2/3 nodes, which have slightly increased in size. Increased paranasal sinus inflammatory changes including a right maxillary sinus air-fluid level. This a nonspecific finding that can be seen setting of acute sinusitis. Electronically Signed   By: Guadlupe Spanish M.D.   On: 01/26/2020  16:03      Flora Lipps, MD  [facility] 01/28/2020

## 2020-01-29 ENCOUNTER — Encounter (HOSPITAL_COMMUNITY): Payer: Self-pay

## 2020-01-29 DIAGNOSIS — G241 Genetic torsion dystonia: Secondary | ICD-10-CM | POA: Diagnosis not present

## 2020-01-29 DIAGNOSIS — J039 Acute tonsillitis, unspecified: Secondary | ICD-10-CM | POA: Diagnosis not present

## 2020-01-29 DIAGNOSIS — B2790 Infectious mononucleosis, unspecified without complication: Secondary | ICD-10-CM | POA: Diagnosis not present

## 2020-01-29 DIAGNOSIS — R590 Localized enlarged lymph nodes: Secondary | ICD-10-CM | POA: Diagnosis not present

## 2020-01-29 LAB — BASIC METABOLIC PANEL
Anion gap: 8 (ref 5–15)
BUN: 10 mg/dL (ref 6–20)
CO2: 25 mmol/L (ref 22–32)
Calcium: 7.7 mg/dL — ABNORMAL LOW (ref 8.9–10.3)
Chloride: 105 mmol/L (ref 98–111)
Creatinine, Ser: 0.62 mg/dL (ref 0.44–1.00)
GFR calc Af Amer: >60 mL/min (ref >60)
GFR calc non Af Amer: >60 mL/min (ref >60)
Glucose, Bld: 114 mg/dL — ABNORMAL HIGH (ref 70–99)
Potassium: 4 mmol/L (ref 3.5–5.1)
Sodium: 138 mmol/L (ref 135–145)

## 2020-01-29 LAB — CBC
HCT: 34.6 % — ABNORMAL LOW (ref 36.0–46.0)
Hemoglobin: 10.6 g/dL — ABNORMAL LOW (ref 12.0–15.0)
MCH: 24.1 pg — ABNORMAL LOW (ref 26.0–34.0)
MCHC: 30.6 g/dL (ref 30.0–36.0)
MCV: 78.6 fL — ABNORMAL LOW (ref 80.0–100.0)
Platelets: 166 10*3/uL (ref 150–400)
RBC: 4.4 MIL/uL (ref 3.87–5.11)
RDW: 15.2 % (ref 11.5–15.5)
WBC: 18.2 10*3/uL — ABNORMAL HIGH (ref 4.0–10.5)
nRBC: 0 % (ref 0.0–0.2)

## 2020-01-29 MED ORDER — PREDNISONE 20 MG PO TABS: ORAL_TABLET | ORAL | 0 refills | Status: DC

## 2020-01-29 MED ORDER — AMOXICILLIN-POT CLAVULANATE 875-125 MG PO TABS: 1.0000 | ORAL_TABLET | Freq: Two times a day (BID) | ORAL | 0 refills | Status: DC

## 2020-01-29 MED ORDER — FLUTICASONE PROPIONATE 50 MCG/ACT NA SUSP: 2.0000 | Freq: Every day | NASAL | 0 refills | Status: DC

## 2020-01-29 MED ORDER — OXYMETAZOLINE HCL 0.05 % NA SOLN: 1.0000 | Freq: Two times a day (BID) | NASAL | 0 refills | Status: AC

## 2020-01-29 NOTE — Discharge Summary (Signed)
Physician Discharge Summary  Martha Bray OJJ:[id number] DOB: 01/19/1996 DOA: 01/26/2020  PCP: Sandford Craze, NP  Admit date: 01/26/2020 Discharge date: 01/29/2020  Admitted From: Home  Discharge disposition: Home  Recommendations for Outpatient Follow-Up:   . Follow up with your primary care provider in one week.  . Check CBC, BMP in the next visit.   Discharge Diagnosis:   Principal Problem:   Exudative tonsillitis Active Problems:   DOPA responsive dystonia   Cervical lymphadenopathy   Infectious mononucleosis   Dysphagia   Discharge Condition: Improved.  Diet recommendation: soft diet, advance as tolerated  Wound care: None.  Code status: Full.   History of Present Illness:   Martha Terryis a 24 y.o.femalewith medical history significant ofdopa responsive dystonia presented to emergency department with chief complaint of progressively worseningsore throat x 1 week.Patient had a telemedicine visit with PCP  and was sent to the emergency department for further evaluation. Patient was seen in the ED on 01/19/2020 and tested positive for mono, negative for strep. She had CT scan soft tissue that showed enlargement of nasopharyngeal and oropharyngeal lymphoid tissues, cervical adenopathy, no abscess. Discharged home with Advil and Zofran. Patient states she followed up with her PCP 2 days later because she was feeling worse. She was given 5 days of p.o. prednisone. She states she took that as prescribed however continued to get worse. Patient then presented to the ED with increasing difficulty swallowing sore throat and difficulty taking oral intake. Patient was hemodynamically stable in ED. Lab remarkable for leukocytosis with normal diff, glucose 140. CT neck with adenopathy, no abscess.  Patient was given IV Solumedrol in ED. No stridor or respiratory compromise.   Patient was then admitted to the hospital for further evaluation and treatment.   Hospital  Course:   Following conditions were addressed during hospitalization as listed below,  Bilateral gross exudative tonsillitis with significant cervical lymphadenopathy-odynophagia.       Patient with monotest positive 3/9.  Presented with bilateral exudative tonsilitis.  Muffled voice but no stridor on presentation..  Patient received IV solumedrol and Zosyn and was also seen by ENT.   Covid and flu was negative.  Group A strep was negative as well.  ENT recommended steroids and antibiotics.  At this time, she has improved voice and dysphagia.  Speech and swallow also evaluated the patient.  Recommend a soft diet and advance as tolerated.  Patient will need to follow-up with her primary care physician as outpatient in 1 week.  Dopa responsive dystonia.    Continue Sinemet.  Patient to follow-up with neurology as outpatient.  Disposition.  At this time, patient is stable for disposition.  I also spoke with the patient's mother on the phone and updated her about the clinical condition of the patient and the plan for follow-up with her primary care physician.  Medical Consultants:    ENT  Procedures:    None Subjective:   Today, patient feels betterm her voice has improved.  Has improved her swallowing with minimal pain on swallowing..  No trouble breathing.  Discharge Exam:   Vitals:   01/29/20 0448 01/29/20 0747  BP: 115/85 (!) 132/93  Pulse: 87 77  Resp: 18 18  Temp: 98 F (36.7 C) (!) 97.5 F (36.4 C)  SpO2: 95% 94%   Vitals:   01/28/20 1431 01/28/20 2008 01/29/20 0448 01/29/20 0747  BP: 131/89 129/86 115/85 (!) 132/93  Pulse: 71 90 87 77  Resp: 20 20 18 18   Temp:  98.4 F (36.9 C) 97.6 F (36.4 C) 98 F (36.7 C) (!) 97.5 F (36.4 C)  TempSrc: Oral Oral Oral Axillary  SpO2: 93% 95% 95% 94%  Weight:      Height:       General: Alert awake, not in obvious distress, morbidly obese HENT: pupils equally reacting to light,  No scleral pallor or icterus noted. Oral mucosa  is moist. Submandibular cervical lymphadenopathy.  Bilateral tonsillar enlargement with exudates and mild pharyngeal erythema Chest:  Clear breath sounds.  Diminished breath sounds bilaterally. No crackles or wheezes.  CVS: S1 &S2 heard. No murmur.  Regular rate and rhythm. Abdomen: Soft, nontender, nondistended.  Bowel sounds are heard.   Extremities: No cyanosis, clubbing or edema.  Peripheral pulses are palpable. Psych: Alert, awake and oriented, normal mood CNS:  No cranial nerve deficits.  Power equal in all extremities.   Skin: Warm and dry.  No rashes noted.  The results of significant diagnostics from this hospitalization (including imaging, microbiology, ancillary and laboratory) are listed below for reference.     Diagnostic Studies:   CT Soft Tissue Neck W Contrast  Result Date: 01/26/2020 CLINICAL DATA:  Lymphadenopathy EXAM: CT NECK WITH CONTRAST TECHNIQUE: Multidetector CT imaging of the neck was performed using the standard protocol following the bolus administration of intravenous contrast. CONTRAST:  128mL OMNIPAQUE IOHEXOL 300 MG/ML  SOLN COMPARISON:  01/19/2020 FINDINGS: Pharynx and larynx: Significant enlargement of adenoids and palatine tonsils. Lingual tonsils are also prominent. No evidence of peritonsillar abscess. There is narrowing of the nasopharyngeal and oropharyngeal airway. Remainder of the pharynx is unremarkable with patent airway. Salivary glands: Unremarkable. Thyroid: Normal Lymph nodes: Adenopathy is present. Largest node on the right at level 2/3 measures 3.3 cm (previously 3.1 cm). Largest node on the left at level 2 measures 2.6 cm (previously 2.2 cm). Vascular: Major neck vessels are patent. There is compression of bilateral internal jugular veins coursing through the adenopathy. Limited intracranial: No abnormal enhancement Visualized orbits: Unremarkable. Mastoids and visualized paranasal sinuses: Paranasal sinus inflammatory changes including a small right  maxillary sinus air-fluid level. Mastoid air cells are clear. Skeleton: Unremarkable. Upper chest: Included upper lungs are clear. Other: None. IMPRESSION: Persistent enlargement of lymphoid tissues of the nasopharynx and oropharynx with some progression resulting in airway narrowing. There remains no evidence of abscess formation. Cervical adenopathy including bulky level 2/3 nodes, which have slightly increased in size. Increased paranasal sinus inflammatory changes including a right maxillary sinus air-fluid level. This a nonspecific finding that can be seen setting of acute sinusitis. Electronically Signed   By: Macy Mis M.D.   On: 01/26/2020 16:03     Labs:   Basic Metabolic Panel: Recent Labs  Lab 01/26/20 1313 01/26/20 1313 01/28/20 0534 01/29/20 0513  NA 137  --  138 138  K 3.5   < > 5.4* 4.0  CL 100  --  106 105  CO2 24  --  24 25  GLUCOSE 140*  --  98 114*  BUN 13  --  12 10  CREATININE 0.82  --  0.60 0.62  CALCIUM 8.7*  --  8.9 7.7*  MG  --   --  1.9  --    < > = values in this interval not displayed.   GFR Estimated Creatinine Clearance: 119.2 mL/min (by C-G formula based on SCr of 0.62 mg/dL). Liver Function Tests: Recent Labs  Lab 01/26/20 1313  AST 41  ALT 24  ALKPHOS 140*  BILITOT 0.7  PROT 8.9*  ALBUMIN 3.1*   No results for input(s): LIPASE, AMYLASE in the last 168 hours. No results for input(s): AMMONIA in the last 168 hours. Coagulation profile No results for input(s): INR, PROTIME in the last 168 hours.  CBC: Recent Labs  Lab 01/26/20 1313 01/28/20 0534 01/29/20 0513  WBC 23.6* 19.4* 18.2*  NEUTROABS 12.0*  --   --   HGB 12.5 10.3* 10.6*  HCT 40.8 33.1* 34.6*  MCV 78.5* 76.6* 78.6*  PLT 203 181 166   Cardiac Enzymes: No results for input(s): CKTOTAL, CKMB, CKMBINDEX, TROPONINI in the last 168 hours. BNP: Invalid input(s): POCBNP CBG: No results for input(s): GLUCAP in the last 168 hours. D-Dimer No results for input(s): DDIMER  in the last 72 hours. Hgb A1c No results for input(s): HGBA1C in the last 72 hours. Lipid Profile No results for input(s): CHOL, HDL, LDLCALC, TRIG, CHOLHDL, LDLDIRECT in the last 72 hours. Thyroid function studies No results for input(s): TSH, T4TOTAL, T3FREE, THYROIDAB in the last 72 hours.  Invalid input(s): FREET3 Anemia work up No results for input(s): VITAMINB12, FOLATE, FERRITIN, TIBC, IRON, RETICCTPCT in the last 72 hours. Microbiology Recent Results (from the past 240 hour(s))  Group A Strep by PCR     Status: None   Collection Time: 01/19/20 10:54 AM   Specimen: Throat; Sterile Swab  Result Value Ref Range Status   Group A Strep by PCR NOT DETECTED NOT DETECTED Final    Comment: Performed at [facility], [address] [place], [place] [address]  Group A Strep by PCR     Status: None   Collection Time: 01/26/20  1:13 PM   Specimen: Throat; Sterile Swab  Result Value Ref Range Status   Group A Strep by PCR NOT DETECTED NOT DETECTED Final    Comment: Performed at [facility], [address] [place], [place] [address]  Respiratory Panel by RT PCR (Flu A&B, Covid) - Throat     Status: None   Collection Time: 01/26/20  1:20 PM   Specimen: Throat  Result Value Ref Range Status   SARS Coronavirus 2 by RT PCR NEGATIVE NEGATIVE Final    Comment: (NOTE) SARS-CoV-2 target nucleic acids are NOT DETECTED. The SARS-CoV-2 RNA is generally detectable in upper respiratoy specimens during the acute phase of infection. The lowest concentration of SARS-CoV-2 viral copies this assay can detect is 131 copies/mL. A negative result does not preclude SARS-Cov-2 infection and should not be used as the sole basis for treatment or other patient management decisions. A negative result may occur with  improper specimen collection/handling, submission of specimen other than nasopharyngeal swab, presence of viral mutation(s) within the areas targeted by this assay, and  inadequate number of viral copies (<131 copies/mL). A negative result must be combined with clinical observations, patient history, and epidemiological information. The expected result is Negative. Fact Sheet for Patients:  https://www.moore.com/ Fact Sheet for Healthcare Providers:  https://www.young.biz/ This test is not yet ap proved or cleared by the [place] FDA and  has been authorized for detection and/or diagnosis of SARS-CoV-2 by FDA under an Emergency Use Authorization (EUA). This EUA will remain  in effect (meaning this test can be used) for the duration of the COVID-19 declaration under Section 564(b)(1) of the Act, 21 U.S.C. section 360bbb-3(b)(1), unless the authorization is terminated or revoked sooner.    Influenza A by PCR NEGATIVE NEGATIVE Final   Influenza B by PCR NEGATIVE NEGATIVE Final  Comment: (NOTE) The Xpert Xpress SARS-CoV-2/FLU/RSV assay is intended as an aid in  the diagnosis of influenza from Nasopharyngeal swab specimens and  should not be used as a sole basis for treatment. Nasal washings and  aspirates are unacceptable for Xpert Xpress SARS-CoV-2/FLU/RSV  testing. Fact Sheet for Patients: https://www.moore.com/ Fact Sheet for Healthcare Providers: https://www.young.biz/ This test is not yet approved or cleared by the [place] FDA and  has been authorized for detection and/or diagnosis of SARS-CoV-2 by  FDA under an Emergency Use Authorization (EUA). This EUA will remain  in effect (meaning this test can be used) for the duration of the  Covid-19 declaration under Section 564(b)(1) of the Act, 21  U.S.C. section 360bbb-3(b)(1), unless the authorization is  terminated or revoked. Performed at [facility], [address], [place], [place] [address]      Discharge Instructions:   Discharge Instructions    Call MD for:   Complete by: As directed     Worsening symptoms, unable to breathe.   Discharge instructions   Complete by: As directed    Follow up with your primary care physician in 1 week.  Continue antibiotics and prednisone as prescribed.  Please seek medical attention for worsening symptoms. Soft diet for next 2-3 days until swallow is better   Increase activity slowly   Complete by: As directed      Allergies as of 01/29/2020   No Known Allergies     Medication List    STOP taking these medications   HYDROcodone-homatropine 5-1.5 MG/5ML syrup Commonly known as: HYCODAN     TAKE these medications   amoxicillin-clavulanate 875-125 MG tablet Commonly known as: Augmentin Take 1 tablet by mouth 2 (two) times daily for 7 days.   Carbidopa-Levodopa ER 25-100 MG tablet controlled release Commonly known as: SINEMET CR Take 1 tablet by mouth daily.   fluticasone 50 MCG/ACT nasal spray Commonly known as: FLONASE Place 2 sprays into both nostrils daily. Start taking on: January 30, 2020   ibuprofen 600 MG tablet Commonly known as: ADVIL Take 1 tablet (600 mg total) by mouth every 6 (six) hours as needed for up to 30 doses for mild pain or moderate pain.   ondansetron 4 MG disintegrating tablet Commonly known as: Zofran ODT Take 1 tablet (4 mg total) by mouth every 8 (eight) hours as needed for up to 15 doses for nausea or vomiting.   oxymetazoline 0.05 % nasal spray Commonly known as: AFRIN Place 1 spray into both nostrils 2 (two) times daily for 5 days. Use for total of 5 days maxiumu   predniSONE 20 MG tablet Commonly known as: DELTASONE Take 2 pills daily for 3 days, then take 1 pill daily for 3 days   sertraline 50 MG tablet Commonly known as: ZOLOFT Take 1 tablet (50 mg total) by mouth daily.        Time coordinating discharge: 39 minutes  Signed:  Nikeisha Klutz  [facility] 01/29/2020, 10:16 AM

## 2020-02-01 ENCOUNTER — Telehealth: Payer: Self-pay | Admitting: *Deleted

## 2020-02-01 ENCOUNTER — Telehealth: Payer: Self-pay | Admitting: Family

## 2020-02-01 NOTE — Telephone Encounter (Signed)
Transition Care Management Follow-up Telephone Call   Date discharged? 01/29/20   How have you been since you were released from the hospital? Doing a lot better per pt.   Do you understand why you were in the hospital? yes   Do you understand the discharge instructions? yes   Where were you discharged to?  Home with mother   Items Reviewed:  Medications reviewed: yes  Allergies reviewed: yes  Dietary changes reviewed: yes  Referrals reviewed: yes   Functional Questionnaire:   Activities of Daily Living (ADLs):   She states they are independent in the following: ambulation, bathing and hygiene, feeding, continence, grooming, toileting and dressing States they require assistance with the following: na   Any transportation issues/concerns?: no   Any patient concerns? no   Confirmed importance and date/time of follow-up visits scheduled yes  Provider Appointment booked with PCP 02/05/20 virtually. Pt stated she did not want to come into office at this time.   Confirmed with patient if condition begins to worsen call PCP or go to the ER.  Patient was given the office number and encouraged to call back with question or concerns.  : yes

## 2020-02-01 NOTE — Telephone Encounter (Signed)
Please call pt to schedule a 1 week in person hospital follow up.

## 2020-02-01 NOTE — Telephone Encounter (Signed)
Patient scheduled for a face to face visit Friday.

## 2020-02-05 ENCOUNTER — Other Ambulatory Visit: Payer: Self-pay

## 2020-02-05 ENCOUNTER — Telehealth (INDEPENDENT_AMBULATORY_CARE_PROVIDER_SITE_OTHER): Payer: 59 | Admitting: Family

## 2020-02-05 DIAGNOSIS — B2790 Infectious mononucleosis, unspecified without complication: Secondary | ICD-10-CM | POA: Diagnosis not present

## 2020-02-05 NOTE — Progress Notes (Signed)
Virtual Visit via Video Note  I connected with Martha Bray on 02/05/20 at 11:20 AM EDT by a video enabled telemedicine application and verified that I am speaking with the correct person using two identifiers.  Location: Patient: home Provider: office   I discussed the limitations of evaluation and management by telemedicine and the availability of in person appointments. The patient expressed understanding and agreed to proceed.  History of Present Illness:  Patient is a 24 yr old female who presents today for hospital follow up. Last visit she had severe pharyngitis due to mono and was unable to speak/eat/drink. We sent her to the ED for further evaluation and she was admitted from 3/16-3/19.     She reports + fatigue.  Tonsils feel like they have returned to normal.  Eating, drinking, denies fever.    She reports that she did develop a vulvar rash (no pruritis, no discharge) that started after she began augmentin and is mostly resolved.   Observations/Objective:   Gen: Awake, alert, no acute distress Resp: Breathing is even and non-labored Neck: mild swelling right upper neck- minimal swelling noted left upper neck Psych: calm/pleasant demeanor Neuro: Alert and Oriented x 3, + facial symmetry, speech is clear.   Assessment and Plan:  Pharyngitis- clinically resolved.  Mononucleosis- we discussed getting adequate rest and hydration as her body heals. She will let me know if symptoms worsen or if symptoms do not continue to improve in the coming weeks.   Follow Up Instructions:    I discussed the assessment and treatment plan with the patient. The patient was provided an opportunity to ask questions and all were answered. The patient agreed with the plan and demonstrated an understanding of the instructions.   The patient was advised to call back or seek an in-person evaluation if the symptoms worsen or if the condition fails to improve as anticipated.  Lemont Fillers, NP

## 2020-05-30 ENCOUNTER — Telehealth: Payer: Self-pay

## 2020-05-30 NOTE — Telephone Encounter (Signed)
Called patient but no answer.

## 2020-05-30 NOTE — Telephone Encounter (Signed)
Nurse Assessment Nurse: Self, RN, Herbert Seta Date/Time (Eastern Time): 05/28/2020 9:02:17 AM Confirm and document reason for call. If symptomatic, describe symptoms. ---Caller believes she has viral tonsillitis . Caller wants it to be documented in her chart , caller says she has swollen tonsils , no fever. Has the patient had close contact with a person known or suspected to have the novel coronavirus illness OR traveled / lives in area with major community spread (including international travel) in the last 14 days from the onset of symptoms? * If Asymptomatic, screen for exposure and travel within the last 14 days. ---No Does the patient have any new or worsening symptoms? ---Yes Will a triage be completed? ---Yes Related visit to physician within the last 2 weeks? ---No Does the PT have any chronic conditions? (i.e. diabetes, asthma, this includes High risk factors for pregnancy, etc.) ---Yes List chronic conditions. ---Dystonia Is the patient pregnant or possibly pregnant? (Ask all females between the ages of 24-55) ---No Is this a behavioral health or substance abuse call? ---No Guidelines Guideline Title Affirmed Question Affirmed Notes Nurse Date/Time (Eastern Time) Sore Throat [1] Sore throat with cough/cold symptoms AND [2] present > 5 days Self, RN, Herbert Seta 05/28/2020 9:04:27 AM Disp. Time (Eastern Time) Disposition Final UserPLEASE NOTE: All timestamps contained within this report are represented as [place] Standard Time. CONFIDENTIALTY NOTICE: This fax transmission is intended only for the addressee. It contains information that is legally privileged, confidential or otherwise protected from use or disclosure. If you are not the intended recipient, you are strictly prohibited from reviewing, disclosing, copying using or disseminating any of this information or taking any action in reliance on or regarding this information. If you have received this fax in error, please notify  us immediately by telephone so that we can arrange for its return to [place]. Phone: [phone], Toll-Free: [phone], Fax: [phone] Page: 2 of 2 Call Id: [id number] 05/28/2020 9:07:47 AM SEE PCP WITHIN 3 DAYS Yes Self, RN, Herbert Seta Caller Disagree/Comply Comply Caller Understands Yes PreDisposition Call Doctor Care Advice Given Per Guideline SEE PCP WITHIN 3 DAYS: * You need to be seen within 2 or 3 days. SORE THROAT: * Here are some simple things you can do to treat and reduce sore throat pain. * Sip warm chicken broth or apple juice. * Suck on hard candy or an over-the-counter throat lozenge. * Gargle with warm salt water four times a day. To make salt water, put 1/2 teaspoon of salt in 8 oz (240 ml) of warm water. PAIN AND FEVER MEDICINES: DRINK PLENTY LIQUIDS: * Drink plenty of liquids. This is important to prevent dehydration. CALL BACK IF: * You become worse. CARE ADVICE given per Sore Throat (Adult) guideline. Referrals REFERRED TO PCP OFFICE

## 2020-06-01 NOTE — Telephone Encounter (Signed)
Patient having sore throat with tonsillitis again, [facility] She will call back for follow up after that.

## 2020-06-07 ENCOUNTER — Other Ambulatory Visit: Payer: Self-pay

## 2020-06-07 ENCOUNTER — Telehealth (INDEPENDENT_AMBULATORY_CARE_PROVIDER_SITE_OTHER): Payer: 59 | Admitting: Family

## 2020-06-07 ENCOUNTER — Encounter: Payer: Self-pay | Admitting: Family

## 2020-06-07 DIAGNOSIS — J039 Acute tonsillitis, unspecified: Secondary | ICD-10-CM

## 2020-06-07 NOTE — Progress Notes (Signed)
Virtual Visit via Video Note  I connected with Martha Bray on 06/07/20 at 11:20 AM EDT by a video enabled telemedicine application and verified that I am speaking with the correct person using two identifiers.  Location: Patient: home Provider: work   I discussed the limitations of evaluation and management by telemedicine and the availability of in person appointments. The patient expressed understanding and agreed to proceed. Only the patient and myself were present for today's video call.   History of Present Illness:  Patient is a 24 yr old female who presents today for follow up of her pharyngitis.  [facility]. She had a rapid strep 06/01/20 which was negative. She was treated empirically with azithromycin empirically due to finding of tonsillar exudate. Reports that she is doing much better. She completed the zpak yesterday. Pain is completely resolved and she is tolerating PO's.  She had severe tonsillitis back in the spring due to mono which required hospitalization.     Observations/Objective:   Gen: Awake, alert, no acute distress Resp: Breathing is even and non-labored Psych: calm/pleasant demeanor Neuro: Alert and Oriented x 3, + facial symmetry, speech is clear.   Assessment and Plan:  Tonsillitis- clinically resolved. She would like a referral to ENT for consultation due to multiple episodes of tonsillitis. Referral has been placed.  Follow Up Instructions:    I discussed the assessment and treatment plan with the patient. The patient was provided an opportunity to ask questions and all were answered. The patient agreed with the plan and demonstrated an understanding of the instructions.   The patient was advised to call back or seek an in-person evaluation if the symptoms worsen or if the condition fails to improve as anticipated.  Lemont Fillers, NP

## 2020-08-05 ENCOUNTER — Other Ambulatory Visit: Payer: Self-pay | Admitting: Family

## 2020-08-25 ENCOUNTER — Encounter (INDEPENDENT_AMBULATORY_CARE_PROVIDER_SITE_OTHER): Payer: Self-pay | Admitting: Otolaryngology

## 2020-08-25 ENCOUNTER — Other Ambulatory Visit: Payer: Self-pay

## 2020-08-25 ENCOUNTER — Ambulatory Visit (INDEPENDENT_AMBULATORY_CARE_PROVIDER_SITE_OTHER): Payer: 59 | Admitting: Otolaryngology

## 2020-08-25 ENCOUNTER — Encounter (INDEPENDENT_AMBULATORY_CARE_PROVIDER_SITE_OTHER): Payer: Self-pay

## 2020-08-25 VITALS — Temp 94.8°F

## 2020-08-25 DIAGNOSIS — J0391 Acute recurrent tonsillitis, unspecified: Secondary | ICD-10-CM | POA: Diagnosis not present

## 2020-08-25 DIAGNOSIS — J351 Hypertrophy of tonsils: Secondary | ICD-10-CM | POA: Diagnosis not present

## 2020-08-25 NOTE — Progress Notes (Signed)
Work note for surgery.

## 2020-08-25 NOTE — Progress Notes (Signed)
HPI: Martha Bray is a 24 y.o. female who presents is referred by her PCP for evaluation of recurrent chronic tonsillitis.  She is always had enlarged tonsils.  She has had frequent tonsil infections and back in March was hospitalized because of mono and tonsillitis.  But she is always had history of recurrent infections has had 2 infections that were treated with antibiotics since her discharge from the hospital.  She presents today to discuss possible tonsillectomy.. She takes birth control pill as well as medicine for muscular dystonia or Parkinson's disease as well as Prozac. No history of cardiac disease.  She is on no blood thinners.  Past Medical History:  Diagnosis Date  . Dermatitis 11/14/2016  . DOPA responsive dystonia   . Dystonia    doparesponsive dystonia   Past Surgical History:  Procedure Laterality Date  . WISDOM TOOTH EXTRACTION  03/2018   Social History   Socioeconomic History  . Marital status: Single    Spouse name: Not on file  . Number of children: Not on file  . Years of education: Not on file  . Highest education level: Not on file  Occupational History  . Occupation: Consulting civil engineer  Tobacco Use  . Smoking status: Never Smoker  . Smokeless tobacco: Never Used  Substance and Sexual Activity  . Alcohol use: No    Alcohol/week: 0.0 standard drinks  . Drug use: No  . Sexual activity: Not on file  Other Topics Concern  . Not on file  Social History Narrative   Lives with Chief Financial Officer at [employer]   Financial Resource Strain:   . Difficulty of Paying Living Expenses: Not on file  Food Insecurity:   . Worried About Programme researcher, broadcasting/film/video in the Last Year: Not on file  . Ran Out of Food in the Last Year: Not on file  Transportation Needs:   . Lack of Transportation (Medical): Not on file  . Lack of Transportation (Non-Medical): Not on file  Physical Activity:   . Days of Exercise per Week: Not on file  . Minutes of Exercise per  Session: Not on file  Stress:   . Feeling of Stress : Not on file  Social Connections:   . Frequency of Communication with Friends and Family: Not on file  . Frequency of Social Gatherings with Friends and Family: Not on file  . Attends Religious Services: Not on file  . Active Member of Clubs or Organizations: Not on file  . Attends Banker Meetings: Not on file  . Marital Status: Not on file   No family history on file. Allergies  Allergen Reactions  . Augmentin [Amoxicillin-Pot Clavulanate]     Vulvar rash   Prior to Admission medications   Medication Sig Start Date End Date Taking? Authorizing Provider  Carbidopa-Levodopa ER (SINEMET CR) 25-100 MG tablet controlled release Take 1 tablet by mouth daily. 11/19/19  Yes Tat, Octaviano Batty, DO  ibuprofen (ADVIL) 600 MG tablet Take 1 tablet (600 mg total) by mouth every 6 (six) hours as needed for up to 30 doses for mild pain or moderate pain. 01/19/20  Yes Terald Sleeper, MD  sertraline (ZOLOFT) 50 MG tablet TAKE ONE TABLET BY MOUTH DAILY 08/05/20  Yes Sandford Craze, NP  fluticasone (FLONASE) 50 MCG/ACT nasal spray Place 2 sprays into both nostrils daily. 01/30/20   Pokhrel, Rebekah Chesterfield, MD  ondansetron (ZOFRAN ODT) 4 MG disintegrating tablet Take 1 tablet (4 mg total)  by mouth every 8 (eight) hours as needed for up to 15 doses for nausea or vomiting. 01/19/20   Terald Sleeper, MD     Positive ROS: Otherwise negative  All other systems have been reviewed and were otherwise negative with the exception of those mentioned in the HPI and as above.  Physical Exam: Constitutional: Alert, well-appearing, no acute distress Ears: External ears without lesions or tenderness. Ear canals are clear bilaterally with intact, clear TMs.  Nasal: External nose without lesions. Septum midline. Clear nasal passages bilaterally. Oral: Lips and gums without lesions. Tongue and palate mucosa without lesions. Posterior oropharynx clear.  Patient  with a large 3+ tonsils bilaterally no acute exudate. Neck: No palpable adenopathy or masses Lungs clear to auscultation Cardiac exam: Regular rate and rhythm without murmur. Respiratory: Breathing comfortably  Skin: No facial/neck lesions or rash noted.  Procedures  Assessment: Recurrent tonsillitis with tonsillar hypertrophy  Plan: We will plan on scheduling tonsillectomy at her convenience in the next few weeks.  I reviewed the morbidity associated with tonsillectomy with her as well as the risk of bleeding. Discussed with her that she will have to be out of work for 2 weeks following tonsillectomy as she works at a call center and talks a lot.   Narda Bonds, MD   CC:

## 2020-09-15 ENCOUNTER — Other Ambulatory Visit (INDEPENDENT_AMBULATORY_CARE_PROVIDER_SITE_OTHER): Payer: Self-pay | Admitting: Otolaryngology

## 2020-09-15 DIAGNOSIS — J0391 Acute recurrent tonsillitis, unspecified: Secondary | ICD-10-CM

## 2020-09-15 DIAGNOSIS — J351 Hypertrophy of tonsils: Secondary | ICD-10-CM

## 2020-09-15 MED ORDER — HYDROCODONE-ACETAMINOPHEN 7.5-325 MG/15ML PO SOLN: 10.0000 mL | Freq: Four times a day (QID) | ORAL | 0 refills | Status: DC | PRN

## 2020-10-31 ENCOUNTER — Telehealth (INDEPENDENT_AMBULATORY_CARE_PROVIDER_SITE_OTHER): Payer: 59 | Admitting: Medical

## 2020-10-31 DIAGNOSIS — U071 COVID-19: Secondary | ICD-10-CM

## 2020-10-31 MED ORDER — BENZONATATE 100 MG PO CAPS: 100.0000 mg | ORAL_CAPSULE | Freq: Three times a day (TID) | ORAL | 0 refills | Status: DC | PRN

## 2020-10-31 NOTE — Progress Notes (Signed)
   Subjective:    Patient ID: Martha Bray, female    DOB: 03/16/1996, 24 y.o.   MRN: [id number]  HPI  Virtual Visit via Telephone Note  I connected with Gaynel Schaafsma on 10/31/20 at  4:00 PM EST by telephone and verified that I am speaking with the correct person using two identifiers.  Location: Patient: home Provider: home   I discussed the limitations, risks, security and privacy concerns of performing an evaluation and management service by telephone and the availability of in person appointments. I also discussed with the patient that there may be a patient responsible charge related to this service. The patient expressed understanding and agreed to proceed.   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 Pt states last Tuesday she tested negative. On Friday her symptoms just started out as cough, nasal congested runny nose, bodyaches and chest congestion.    Pt states her work place   Pt had 2 vaccines but did not get booster yet.  Pt does not have 02 sat monitor.        Observations/Objective:  General- no acute distress, pleasant, alert and oriented.   Assessment and Plan: You have recent Covid diagnosis with positive test yesterday.    Would recommend that you use tylenol or ibuperofen for fever.  Vitamin D over-the-counter  2000 to 4000 international units daily.  Also use zinc 50 mg daily.  Making benzonatate available for cough.  I do think it would be beneficial for you to get an O2 sat monitor and check your oxygen saturations daily.  Above 96% would be low.  You are young and your overall Covid risk for complication is low.  But we do want to know how you are doing clinically and what your oxygen percentage is.  If you are doing worse with low oxygen then would recommend that you call the Covid antibody infusion center to see if you qualify.  With your age and low risk factor score knowing your oxygen percentage  would be helpful.  The phone number to the Covid infusion center is [phone]  Follow-up in 7 to 10 days or as needed.  Esperanza Richters, PA-C   Time spent with patient today was 25  minutes which consisted of chart review, discussing diagnosis, plan going forward, treatment and documentation.  Follow Up Instructions:    I discussed the assessment and treatment plan with the patient. The patient was provided an opportunity to ask questions and all were answered. The patient agreed with the plan and demonstrated an understanding of the instructions.   The patient was advised to call back or seek an in-person evaluation if the symptoms worsen or if the condition fails to improve as anticipated.  Time spent with patient today was 25  minutes which consisted of chart rediew, discussing diagnosis, work up treatment and documentation.   Esperanza Richters, PA-C   Review of Systems     Objective:   Physical Exam        Assessment & Plan:

## 2020-10-31 NOTE — Patient Instructions (Addendum)
You have recent Covid diagnosis with positive test yesterday.    Would recommend that you use tylenol or ibuperofen for fever.  Vitamin D over-the-counter  2000 to 4000 international units daily.  Also use zinc 50 mg daily.  Making benzonatate available for cough.  I do think it would be beneficial for you to get an O2 sat monitor and check your oxygen saturations daily.  Above 96% would be low.  You are young and your overall Covid risk for complication is low.  But we do want to know how you are doing clinically and what your oxygen percentage is.  If you are doing worse with low oxygen then would recommend that you call the Covid antibody infusion center to see if you qualify.  With your age and low risk factor score knowing your oxygen percentage would be helpful.  The phone number to the Covid infusion center is [phone]  Follow-up in 7 to 10 days or as needed.

## 2020-12-03 IMAGING — CT CT NECK W/ CM
4 series · 15 of 33 positions shown, 18 images · IV contrast (omnipaque)
Comparison: 01/19/2020

CLINICAL DATA: Lymphadenopathy

EXAM:
CT NECK WITH CONTRAST
TECHNIQUE: Multidetector CT imaging of the neck was performed using the
standard protocol following the bolus administration of intravenous
contrast.
CONTRAST:  100mL OMNIPAQUE IOHEXOL 300 MG/ML  SOLN

[Series 3: axial neck · axial · 0.46mm/px · z∈[-248,-212]mm · 2 of 109 slices shown]
[im 19/109  bone]
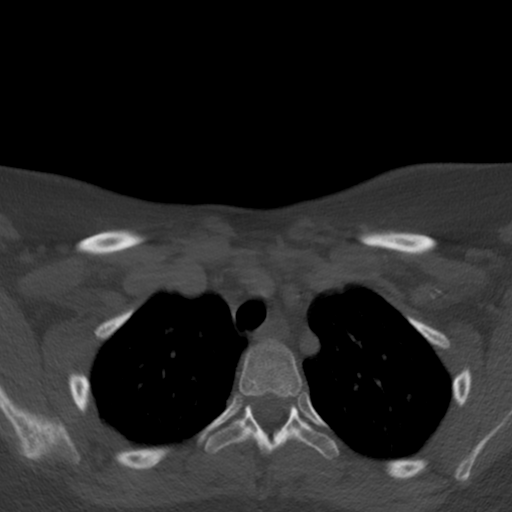
[im 37/109  bone]
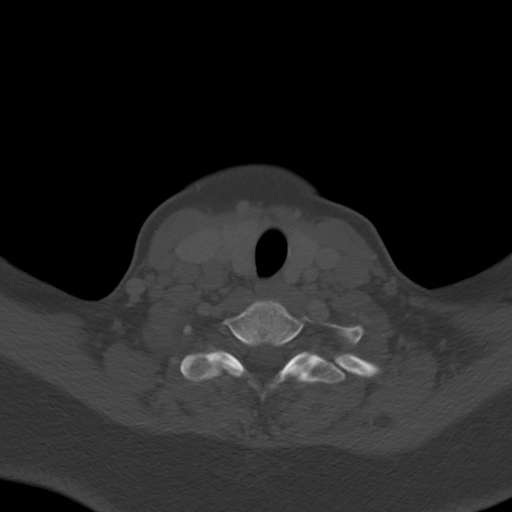

[Series 6: sag neck · sagittal · 0.47mm/px · 5 of 76 slices shown, 6 images]
[im 26/76  bone]
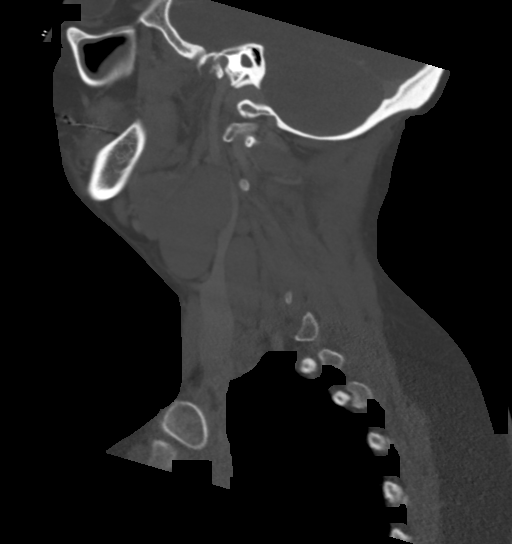
[im 32/76  bone]
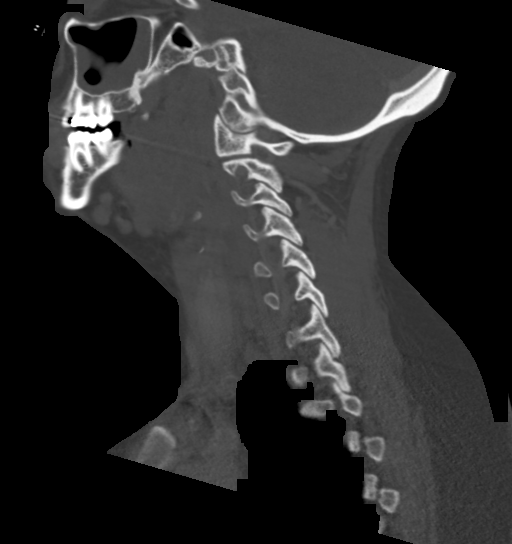
[im 38/76  soft-tissue]
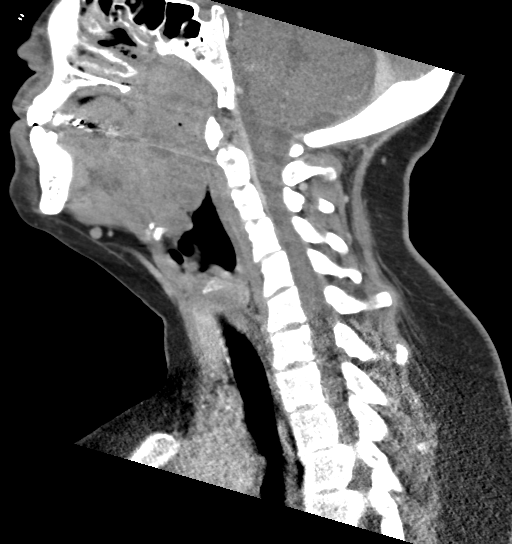
[im 38/76  bone]
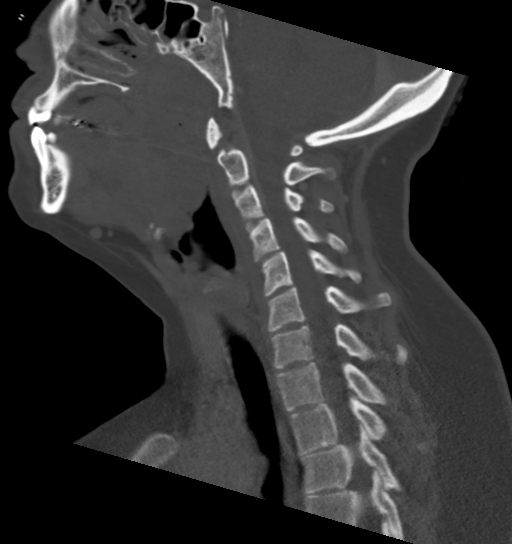
[im 44/76  bone]
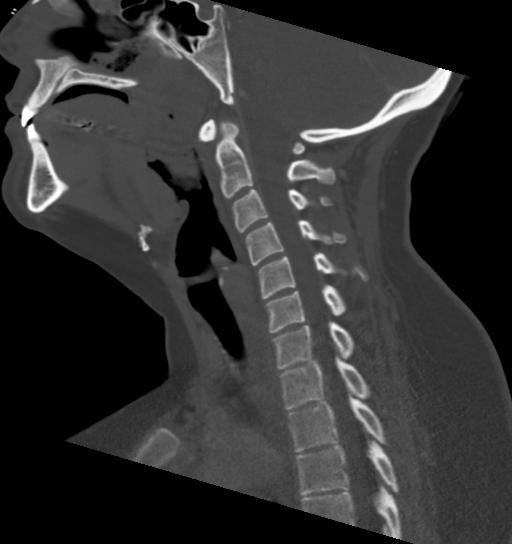
[im 51/76  bone]
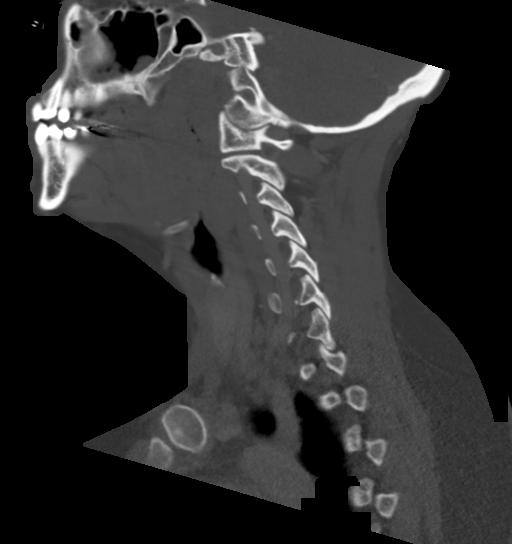

[Series 7: cor neck · coronal · 0.41mm/px · 3 of 107 slices shown]
[im 37/107  bone]
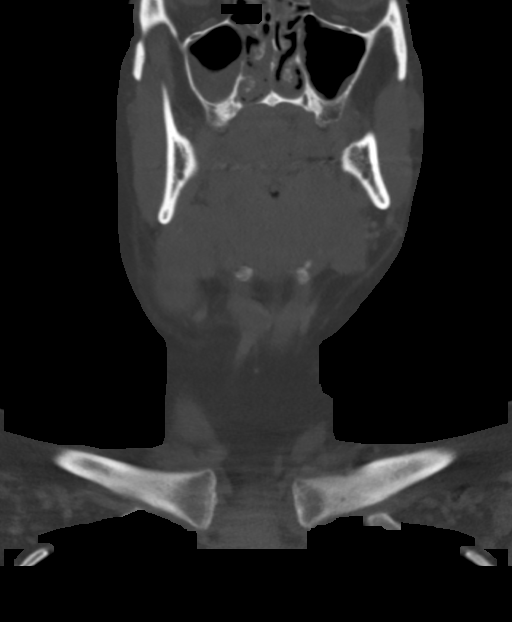
[im 48/107  bone]
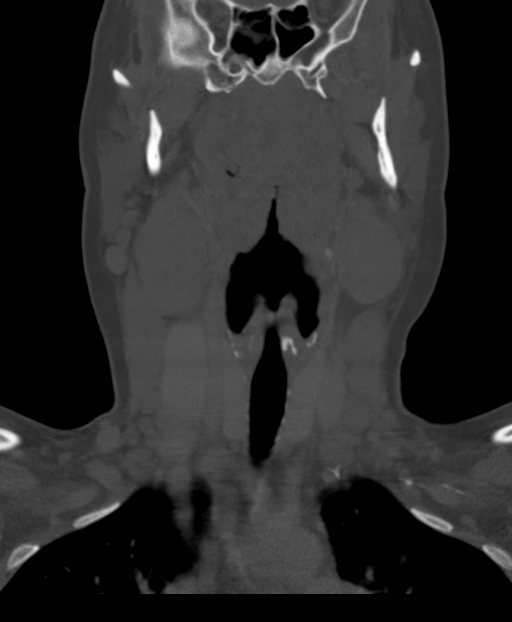
[im 59/107  bone]
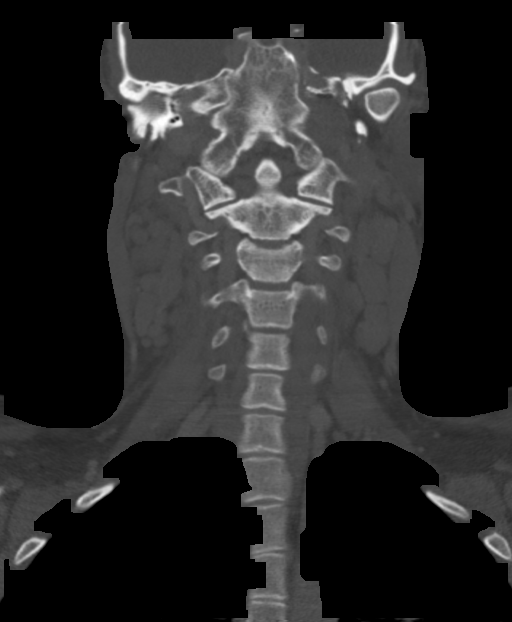

[Series 8: orthogonal ax · axial · 0.39mm/px · z∈[-287,-140]mm · 5 of 123 slices shown, 7 images]
[im 21/123  soft-tissue]
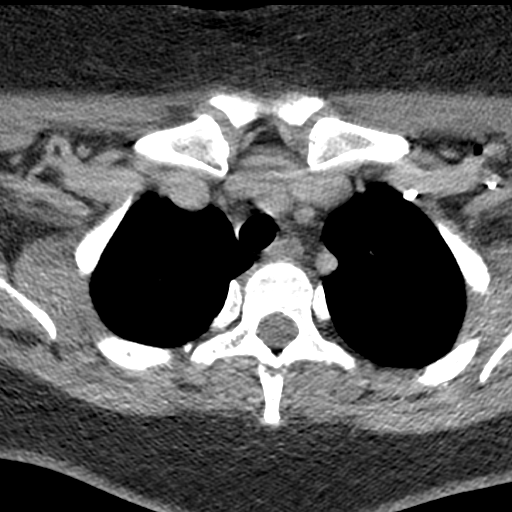
[im 21/123  bone]
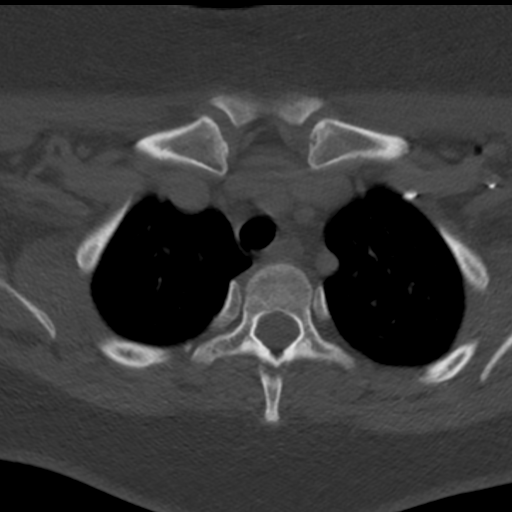
[im 41/123  bone]
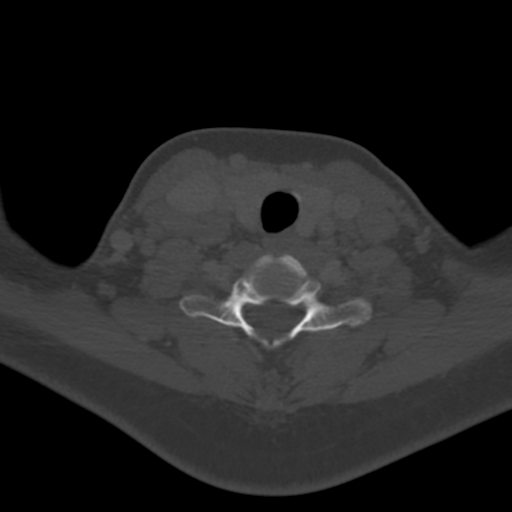
[im 62/123  bone]
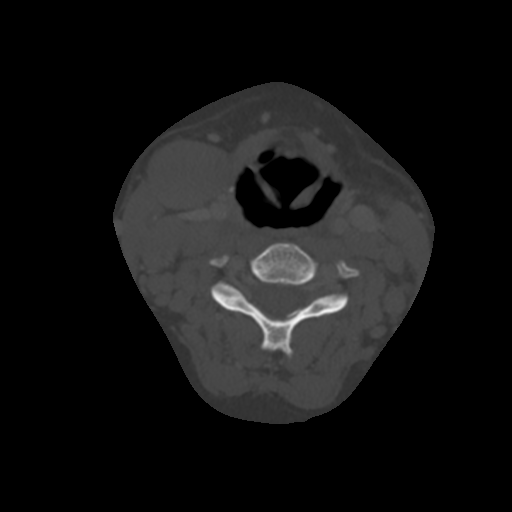
[im 82/123  bone]
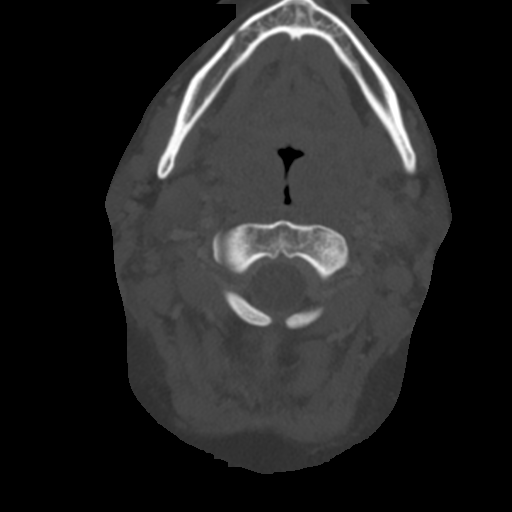
[im 102/123  soft-tissue]
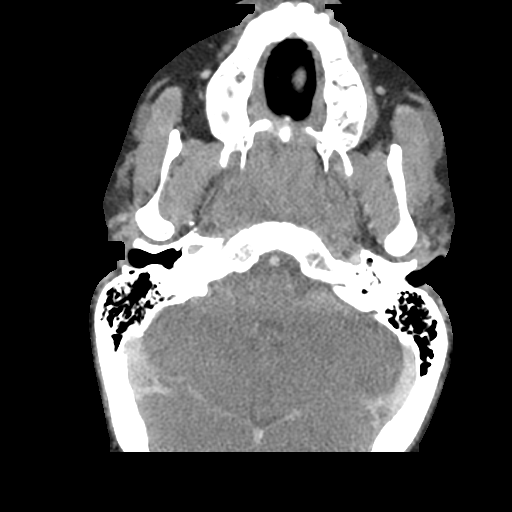
[im 102/123  bone]
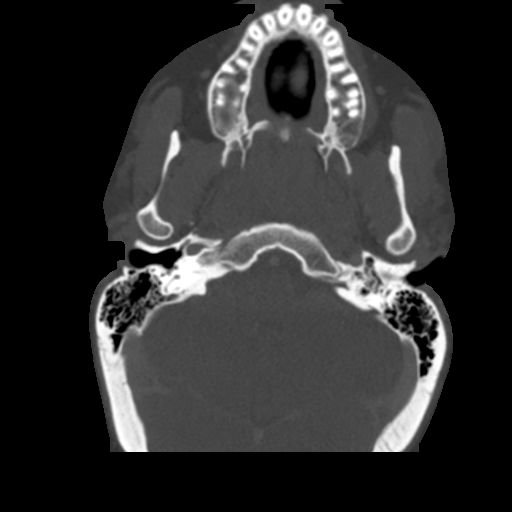

[15 of 33 positions shown; findings below may reference images not displayed]

FINDINGS: Pharynx and larynx: Significant enlargement of adenoids and palatine
tonsils. Lingual tonsils are also prominent. No evidence of
peritonsillar abscess. There is narrowing of the nasopharyngeal and
oropharyngeal airway. Remainder of the pharynx is unremarkable with
patent airway.

Salivary glands: Unremarkable.

Thyroid: Normal

Lymph nodes: Adenopathy is present. Largest node on the right at
level [DATE] measures 3.3 cm (previously 3.1 cm). Largest node on the
left at level 2 measures 2.6 cm (previously 2.2 cm).

Vascular: Major neck vessels are patent. There is compression of
bilateral internal jugular veins coursing through the adenopathy.

Limited intracranial: No abnormal enhancement

Visualized orbits: Unremarkable.

Mastoids and visualized paranasal sinuses: Paranasal sinus
inflammatory changes including a small right maxillary sinus
air-fluid level. Mastoid air cells are clear.

Skeleton: Unremarkable.

Upper chest: Included upper lungs are clear.

Other: None.
IMPRESSION: Persistent enlargement of lymphoid tissues of the nasopharynx and
oropharynx with some progression resulting in airway narrowing.
There remains no evidence of abscess formation. Cervical adenopathy
including bulky level [DATE] nodes, which have slightly increased in
size.

Increased paranasal sinus inflammatory changes including a right
maxillary sinus air-fluid level. This a nonspecific finding that can
be seen setting of acute sinusitis.

## 2020-12-12 ENCOUNTER — Telehealth: Payer: Self-pay | Admitting: Family

## 2020-12-12 ENCOUNTER — Other Ambulatory Visit: Payer: Self-pay | Admitting: Neurology

## 2020-12-12 NOTE — Telephone Encounter (Signed)
Needs OV please.

## 2020-12-12 NOTE — Telephone Encounter (Signed)
Please advised if ov needed

## 2020-12-12 NOTE — Telephone Encounter (Signed)
Patient is requesting a medicine change she would like to stop the Zolft/sertraline 50mg   to 

## 2020-12-13 NOTE — Telephone Encounter (Signed)
Patient was scheduled for 12-16-2020 for rollow up

## 2020-12-14 ENCOUNTER — Telehealth: Payer: Self-pay | Admitting: Family

## 2020-12-14 ENCOUNTER — Other Ambulatory Visit: Payer: Self-pay

## 2020-12-14 DIAGNOSIS — G241 Genetic torsion dystonia: Secondary | ICD-10-CM

## 2020-12-14 NOTE — Progress Notes (Signed)
neu

## 2020-12-14 NOTE — Telephone Encounter (Signed)
  Patient needs referral to her Neurologist due to insurance coverage and will have to pay cash if she does not have the referral on file. Has been seeing Dr. Lurena Joiner Tat at [facility] neurology for DOPA responsive dystonia Her appointment is tomorrow, patient has follow up appointment with [place] Friday 12-16-20

## 2020-12-14 NOTE — Telephone Encounter (Signed)
Patient is requesting a referral to [facility] Neurology, Patient is  already seeing Dr TAT and patient insurance is not covering it. Patient is requesting referral so insurance will pay for it.   Please advise

## 2020-12-14 NOTE — Progress Notes (Addendum)
   Virtual Visit Via Video   The purpose of this virtual visit is to provide medical care while limiting exposure to the novel coronavirus.    Consent was obtained for video visit:  Yes.   Answered questions that patient had about telehealth interaction:  Yes.   I discussed the limitations, risks, security and privacy concerns of performing an evaluation and management service by telemedicine. I also discussed with the patient that there may be a patient responsible charge related to this service. The patient expressed understanding and agreed to proceed.  Pt location: Home Physician Location: office Name of referring provider:  Sandford Craze, NP I connected with Ermelinda Das at patients initiation/request on 12/15/2020 at  8:15 AM EST by video enabled telemedicine application and verified that I am speaking with the correct person using two identifiers. Pt MRN:  [id number] Pt DOB:  09-21-1996 Video Participants:  Ermelinda Das;    Assessment/Plan:   1.  Dopa responsive dystonia  -Stable on carbidopa/levodopa 25/100 CR, 1 tablet in the morning and carbidopa/levodopa 25/100 IR (takes that in the evening as needed, generally 2-3 days per week)  -Patient on oral contraceptives.  Avoiding pregnancy while on levodopa.  Would likely trial off levodopa if patient wanted to get pregnant.  2.  F/u 1 year  Subjective   Patient seen today in follow-up for dopa responsive dystonia.  Outside records that were made available to me were reviewed.  No major changes.  No falls.  Stress will bring out her dystonic symptoms.  She has graduated from college and working for [employer] location.  Current movement d/o meds:  Carbidopa/levodopa 25/100 CR, 1 tablet in the morning Carbidopa/levodopa 25/100 IR, 1 tablet in the evening (does not do this every day)   Current Outpatient Medications on File Prior to Visit  Medication Sig Dispense Refill  . desogestrel-ethinyl estradiol (MIRCETTE)  0.15-0.02/0.01 MG (21/5) tablet Take 1 tablet by mouth daily.    . sertraline (ZOLOFT) 50 MG tablet TAKE ONE TABLET BY MOUTH DAILY 30 tablet 5   No current facility-administered medications on file prior to visit.     Objective   Vitals:   12/15/20 0754  Weight: 220 lb (99.8 kg)  Height: 5\' 3"  (1.6 m)   GEN:  The patient appears stated age and is in NAD.  Neurological examination:  Orientation: The patient is alert and oriented x3. Cranial nerves: There is good facial symmetry. There is no facial hypomimia.  The speech is fluent and clear. Soft palate rises symmetrically and there is no tongue deviation. Hearing is intact to conversational tone. Motor: Strength is at least antigravity x 4.   Shoulder shrug is equal and symmetric.    Movement examination: Tone: unable Abnormal movements: none Coordination:  There is no decremation with RAM's, in the UE bilaterally Gait and Station: The patient ambulates well in the home    Follow up Instructions      -I discussed the assessment and treatment plan with the patient. The patient was provided an opportunity to ask questions and all were answered. The patient agreed with the plan and demonstrated an understanding of the instructions.   The patient was advised to call back or seek an in-person evaluation if the symptoms worsen or if the condition fails to improve as anticipated.     , DO

## 2020-12-14 NOTE — Telephone Encounter (Signed)
Please place referral

## 2020-12-14 NOTE — Telephone Encounter (Signed)
Referral entered, patient notified.  

## 2020-12-15 ENCOUNTER — Encounter: Payer: Self-pay | Admitting: Neurology

## 2020-12-15 ENCOUNTER — Telehealth: Payer: 59 | Admitting: Neurology

## 2020-12-15 ENCOUNTER — Other Ambulatory Visit: Payer: Self-pay

## 2020-12-15 VITALS — Ht 63.0 in | Wt 220.0 lb

## 2020-12-15 DIAGNOSIS — G241 Genetic torsion dystonia: Secondary | ICD-10-CM | POA: Diagnosis not present

## 2020-12-15 MED ORDER — CARBIDOPA-LEVODOPA ER 25-100 MG PO TBCR: 1.0000 | EXTENDED_RELEASE_TABLET | Freq: Every day | ORAL | 11 refills | Status: DC

## 2020-12-15 MED ORDER — CARBIDOPA-LEVODOPA 25-100 MG PO TABS: 1.0000 | ORAL_TABLET | ORAL | 11 refills | Status: DC | PRN

## 2020-12-16 ENCOUNTER — Ambulatory Visit: Payer: 59 | Admitting: Family

## 2020-12-16 ENCOUNTER — Encounter: Payer: Self-pay | Admitting: Family

## 2020-12-16 ENCOUNTER — Telehealth: Payer: Self-pay | Admitting: Family

## 2020-12-16 VITALS — BP 109/65 | HR 93 | Temp 99.0°F | Resp 16 | Ht 63.0 in | Wt 244.2 lb

## 2020-12-16 DIAGNOSIS — Z6841 Body Mass Index (BMI) 40.0 and over, adult: Secondary | ICD-10-CM | POA: Diagnosis not present

## 2020-12-16 DIAGNOSIS — G241 Genetic torsion dystonia: Secondary | ICD-10-CM | POA: Diagnosis not present

## 2020-12-16 DIAGNOSIS — F418 Other specified anxiety disorders: Secondary | ICD-10-CM

## 2020-12-16 MED ORDER — BUPROPION HCL ER (XL) 150 MG PO TB24: 150.0000 mg | ORAL_TABLET | Freq: Every day | ORAL | 1 refills | Status: DC

## 2020-12-16 MED ORDER — BUSPIRONE HCL 7.5 MG PO TABS: 7.5000 mg | ORAL_TABLET | Freq: Two times a day (BID) | ORAL | 1 refills | Status: DC

## 2020-12-16 MED ORDER — SERTRALINE HCL 25 MG PO TABS: ORAL_TABLET | ORAL | 0 refills | Status: DC

## 2020-12-16 NOTE — Telephone Encounter (Signed)
Request faxed to Dr. Loreta Ave.

## 2020-12-16 NOTE — Patient Instructions (Addendum)
Change zoloft to 25mg  once daily for 2 weeks, then 1/2 tab once daily for 2 weeks then stop. Start wellbutrin xl 150mg  once daily (for depression) and buspar 7.5 mg twice daily for anxiety. Please get a covid booster ).

## 2020-12-16 NOTE — Telephone Encounter (Signed)
Please call GYN and request copy of Pap, Dr. Malachi Carl.

## 2020-12-16 NOTE — Progress Notes (Signed)
Subjective:    Patient ID: Martha Bray, female    DOB: 1995-12-09, 25 y.o.   MRN: [id number]  HPI  Patient is a 25 year old female who presents today for follow-up.  Depression/anxiety- She is currently maintained on zoloft 50mg . Reports that sertralne has helped her mood but has caused weight gain.  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  She is maintained on carbidopa/levodopa and has been stable.    Review of Systems See HPI  Past Medical History:  Diagnosis Date  . Dermatitis 11/14/2016  . DOPA responsive dystonia   . Dystonia    doparesponsive dystonia     Social History   Socioeconomic History  . Marital status: Single    Spouse name: Not on file  . Number of children: Not on file  . Years of education: Not on file  . Highest education level: Not on file  Occupational History  . Occupation: 01/12/2017  Tobacco Use  . Smoking status: Never Smoker  . Smokeless tobacco: Never Used  Vaping Use  . Vaping Use: Never used  Substance and Sexual Activity  . Alcohol use: Yes    Alcohol/week: 0.0 standard drinks    Comment: rarely  . Drug use: No  . Sexual activity: Not on file  Other Topics Concern  . 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 WISDOM TOOTH EXTRACTION  03/2018    No family history on file.  Allergies  Allergen Reactions  . Augmentin [Amoxicillin-Pot Clavulanate]     Vulvar rash    Current Outpatient Medications on File Prior to Visit  Medication Sig Dispense Refill  . carbidopa-levodopa (SINEMET IR) 25-100 MG tablet Take 1 tablet by mouth as needed. 2-3 times a week 30 tablet 11  . Carbidopa-Levodopa ER (SINEMET CR) 25-100 MG tablet controlled release Take 1 tablet by mouth daily. 30 tablet 11  . desogestrel-ethinyl estradiol (MIRCETTE) 0.15-0.02/0.01 MG (21/5) tablet Take 1 tablet by mouth daily.    . sertraline (ZOLOFT) 50 MG tablet TAKE ONE TABLET BY MOUTH DAILY 30 tablet 5   No current facility-administered medications on file prior to visit.    BP 109/65 (BP Location: Right Arm, Patient Position: Sitting, Cuff Size: Small)   Pulse 93   Temp 99 F (37.2 C) (Oral)   Resp 16   Ht 5\' 3"  (1.6 m)   Wt 244 lb 3.2 oz (110.8 kg)   SpO2 98%   BMI 43.26 kg/m       Objective:   Physical Exam Constitutional:      Appearance: Normal  appearance.  Neurological:     Mental Status: She is alert and oriented to person, place, and time.  Psychiatric:        Attention and Perception: Attention normal.        Mood and Affect: Mood normal.        Speech: Speech normal.        Behavior: Behavior normal.        Cognition and Memory: Cognition normal.        Judgment: Judgment normal.           Assessment & Plan:  Depression/Anxiety-  Depression screen [facility] 2/9 12/16/2020 11/24/2019  Decreased Interest 2 1  Down, Depressed, Hopeless 2 -  PHQ - 2 Score 4 1  Altered sleeping 3 -  Tired, decreased energy 3 3  Change in appetite 3 1  Feeling bad or failure about yourself  3 2  Trouble concentrating 2 2  Moving slowly or fidgety/restless 0 0  Suicidal thoughts 1 1  PHQ-9 Score 19 -  Difficult doing work/chores  Very difficult Somewhat difficult   GAD 7 : Generalized Anxiety Score 12/16/2020 11/24/2019  Nervous, Anxious, on Edge 3 3  Control/stop worrying 3 3  Worry too much - different things 3 3  Trouble relaxing 2 2  Restless 0 1  Easily annoyed or irritable 2 2  Afraid - awful might happen 1 1  Total GAD 7 Score 14 15  Anxiety Difficulty Very difficult Very difficult   Depression/anxiety are uncontrolled.  She is upset by the weight gain that she attributes to sertraline.  I have recommended the following:  Change zoloft to 25mg  once daily for 2 weeks, then 1/2 tab once daily for 2 weeks then stop. Start wellbutrin xl 150mg  once daily (for depression) and buspar 7.5 mg twice daily for anxiety.  I have also recommended that she search for an in and try to get re-established with a counselor.  Dopa Responsive Dystonia- stable. Management per neuro.  Obesity- discussed counting calories and keeping daily intake <1500 calories/day as well as adding 30 minutes of exercise 5 days a week.  This visit occurred during the SARS-CoV-2 public health emergency.  Safety protocols were in place, including screening questions prior to the visit, additional usage of staff PPE, and extensive cleaning of exam room while observing appropriate contact time as indicated for disinfecting solutions.

## 2021-01-09 ENCOUNTER — Other Ambulatory Visit: Payer: Self-pay | Admitting: Family

## 2021-01-09 ENCOUNTER — Telehealth: Payer: Self-pay | Admitting: Family

## 2021-01-09 NOTE — Telephone Encounter (Signed)
See mychart.  

## 2021-01-19 ENCOUNTER — Telehealth: Payer: Self-pay | Admitting: Family

## 2021-01-19 NOTE — Telephone Encounter (Signed)
Ok with me 

## 2021-01-19 NOTE — Telephone Encounter (Signed)
Pt called and said that she has [employer] and that they said they wont cover her if she sees an NP and she said [facility] doesn't have any MD's or DO's that are accepting new pts or TOC. She asked if she can TOC from [facility] to Dr Luana Shu. Is this okay?

## 2021-01-20 NOTE — Telephone Encounter (Signed)
Tried to call, unable to lvm

## 2021-01-20 NOTE — Telephone Encounter (Signed)
OK with me.

## 2021-01-23 NOTE — Telephone Encounter (Signed)
scheduled

## 2021-01-27 ENCOUNTER — Ambulatory Visit: Payer: 59 | Admitting: Family

## 2021-01-27 ENCOUNTER — Other Ambulatory Visit: Payer: Self-pay

## 2021-01-27 VITALS — BP 107/61 | HR 91 | Temp 98.6°F | Resp 16 | Wt 238.0 lb

## 2021-01-27 DIAGNOSIS — F419 Anxiety disorder, unspecified: Secondary | ICD-10-CM | POA: Diagnosis not present

## 2021-01-27 DIAGNOSIS — F32A Depression, unspecified: Secondary | ICD-10-CM

## 2021-01-27 MED ORDER — HYDROXYZINE HCL 25 MG PO TABS: 25.0000 mg | ORAL_TABLET | Freq: Three times a day (TID) | ORAL | 1 refills | Status: AC | PRN

## 2021-01-27 MED ORDER — BUPROPION HCL ER (XL) 300 MG PO TB24: 300.0000 mg | ORAL_TABLET | Freq: Every day | ORAL | 1 refills | Status: DC

## 2021-01-27 NOTE — Progress Notes (Signed)
Subjective:    Patient ID: Martha Bray, female    DOB: 1995-11-27, 25 y.o.   MRN: [id number]  HPI  Patient is a 25 yr old female who presents today for follow up of her anxiety and depression. Last visit she noted improvement in mood on sertraline but was noticing weight gain. Last visit we tapered her off of zoloft and placed her on wellbutrin and buspar.   Wt Readings from Last 3 Encounters:  01/27/21 238 lb (108 kg)  12/16/20 244 lb 3.2 oz (110.8 kg)  12/15/20 220 lb (99.8 kg)   She reports appetite is more stable with the transition to wellbutrin.  She feels that anxiety was better controlled on zoloft.  Buspar is helping.    Review of Systems   See HPI  Past Medical History:  Diagnosis Date  . Dermatitis 11/14/2016  . DOPA responsive dystonia   . Dystonia    doparesponsive dystonia     Social History   Socioeconomic History  . Marital status: Single    Spouse name: Not on file  . Number of children: Not on file  . Years of education: Not on file  . Highest education level: Not on file  Occupational History  . Occupation: Consulting civil engineer  Tobacco Use  . Smoking status: Never Smoker  . Smokeless tobacco: Never Used  Vaping Use  . Vaping Use: Never used  Substance and Sexual Activity  . Alcohol use: Yes    Alcohol/week: 0.0 standard drinks    Comment: rarely  . Drug use: No  . Sexual activity: Not on file  Other Topics Concern  . Not on file  Social History Narrative   Lives with Chief Financial Officer at [employer]   Financial Resource Strain: Not on file  Food Insecurity: Not on file  Transportation Needs: Not on file  Physical Activity: Not on file  Stress: Not on file  Social Connections: Not on file  Intimate Partner Violence: Not on file    Past Surgical History:  Procedure Laterality Date  . WISDOM TOOTH EXTRACTION  03/2018    No family history on file.  Allergies  Allergen Reactions  . Augmentin [Amoxicillin-Pot Clavulanate]      Vulvar rash    Current Outpatient Medications on File Prior to Visit  Medication Sig Dispense Refill  . busPIRone (BUSPAR) 7.5 MG tablet Take 1 tablet (7.5 mg total) by mouth 2 (two) times daily. 60 tablet 1  . carbidopa-levodopa (SINEMET IR) 25-100 MG tablet Take 1 tablet by mouth as needed. 2-3 times a week 30 tablet 11  . Carbidopa-Levodopa ER (SINEMET CR) 25-100 MG tablet controlled release Take 1 tablet by mouth daily. 30 tablet 11  . desogestrel-ethinyl estradiol (MIRCETTE) 0.15-0.02/0.01 MG (21/5) tablet Take 1 tablet by mouth daily.    . sertraline (ZOLOFT) 25 MG tablet 1 tablet daily for 2 weeks, then 1/2 tab daily for 2 weeks then stop 21 tablet 0   No current facility-administered medications on file prior to visit.    BP 107/61 (BP Location: Right Arm, Patient Position: Sitting, Cuff Size: Large)   Pulse 91   Temp 98.6 F (37 C) (Oral)   Resp 16   Wt 238 lb (108 kg)   SpO2 99%   BMI 42.16 kg/m       Objective:   Physical Exam Constitutional:      Appearance: Normal appearance.  Neurological:     Mental Status: She is alert and  oriented to person, place, and time.  Psychiatric:        Attention and Perception: Attention and perception normal.        Mood and Affect: Affect is flat.        Speech: Speech normal.        Behavior: Behavior normal.        Cognition and Memory: Cognition normal.           Assessment & Plan:  Depression/anxiety- PHQ and GAD-7 scores are improved but she is still having significant symptoms of depression and anxiety. Advised pt as follows:  Please increase wellbutrin to 300mg  xl once daily.  Continue current dose of buspar. You may use atarax 3x daily as needed for anxiety.   Depression screen [facility] 2/9 01/27/2021 12/16/2020 11/24/2019  Decreased Interest 1 2 1   Down, Depressed, Hopeless 1 2 -  PHQ - 2 Score 2 4 1   Altered sleeping 2 3 -  Tired, decreased energy 3 3 3   Change in appetite 2 3 1   Feeling bad or failure about  yourself  2 3 2   Trouble concentrating 2 2 2   Moving slowly or fidgety/restless 0 0 0  Suicidal thoughts 1 1 1   PHQ-9 Score 14 19 -  Difficult doing work/chores - Very difficult Somewhat difficult   GAD 7 : Generalized Anxiety Score 01/27/2021 12/16/2020 11/24/2019  Nervous, Anxious, on Edge 2 3 3   Control/stop worrying 2 3 3   Worry too much - different things 2 3 3   Trouble relaxing 2 2 2   Restless 0 0 1  Easily annoyed or irritable 1 2 2   Afraid - awful might happen 1 1 1   Total GAD 7 Score 10 14 15   Anxiety Difficulty - Very difficult Very difficult    This visit occurred during the SARS-CoV-2 public health emergency.  Safety protocols were in place, including screening questions prior to the visit, additional usage of staff PPE, and extensive cleaning of exam room while observing appropriate contact time as indicated for disinfecting solutions.

## 2021-01-27 NOTE — Patient Instructions (Signed)
Please increase wellbutrin to 300mg  xl once daily.  Continue current dose of buspar. You may use atarax 3x daily as needed for anxiety.

## 2021-02-10 ENCOUNTER — Other Ambulatory Visit: Payer: Self-pay | Admitting: Family

## 2021-02-10 MED ORDER — BUPROPION HCL ER (XL) 300 MG PO TB24: 300.0000 mg | ORAL_TABLET | Freq: Every day | ORAL | 1 refills | Status: DC

## 2021-03-07 ENCOUNTER — Ambulatory Visit: Payer: 59 | Admitting: Neurology

## 2021-03-10 ENCOUNTER — Other Ambulatory Visit: Payer: Self-pay

## 2021-03-10 ENCOUNTER — Other Ambulatory Visit (HOSPITAL_BASED_OUTPATIENT_CLINIC_OR_DEPARTMENT_OTHER): Payer: Self-pay

## 2021-03-10 ENCOUNTER — Encounter: Payer: Self-pay | Admitting: Family

## 2021-03-10 ENCOUNTER — Telehealth: Payer: Self-pay | Admitting: Family

## 2021-03-10 ENCOUNTER — Ambulatory Visit: Payer: 59 | Attending: Internal Medicine

## 2021-03-10 ENCOUNTER — Ambulatory Visit: Payer: 59 | Admitting: Family

## 2021-03-10 ENCOUNTER — Other Ambulatory Visit: Payer: Self-pay | Admitting: Family Medicine

## 2021-03-10 VITALS — BP 117/63 | HR 93 | Temp 98.7°F | Resp 16 | Ht 64.0 in | Wt 232.0 lb

## 2021-03-10 DIAGNOSIS — Z0000 Encounter for general adult medical examination without abnormal findings: Secondary | ICD-10-CM | POA: Diagnosis not present

## 2021-03-10 DIAGNOSIS — H9319 Tinnitus, unspecified ear: Secondary | ICD-10-CM | POA: Insufficient documentation

## 2021-03-10 DIAGNOSIS — E669 Obesity, unspecified: Secondary | ICD-10-CM | POA: Diagnosis not present

## 2021-03-10 DIAGNOSIS — F418 Other specified anxiety disorders: Secondary | ICD-10-CM | POA: Diagnosis not present

## 2021-03-10 DIAGNOSIS — R739 Hyperglycemia, unspecified: Secondary | ICD-10-CM | POA: Diagnosis not present

## 2021-03-10 DIAGNOSIS — Z23 Encounter for immunization: Secondary | ICD-10-CM

## 2021-03-10 LAB — COMPREHENSIVE METABOLIC PANEL
ALT: 3 U/L (ref 0–35)
AST: 9 U/L (ref 0–37)
Albumin: 4 g/dL (ref 3.5–5.2)
Alkaline Phosphatase: 81 U/L (ref 39–117)
BUN: 11 mg/dL (ref 6–23)
CO2: 27 mEq/L (ref 19–32)
Calcium: 9.3 mg/dL (ref 8.4–10.5)
Chloride: 104 mEq/L (ref 96–112)
Creatinine, Ser: 0.81 mg/dL (ref 0.40–1.20)
GFR: 102.7 mL/min (ref >60.00)
Glucose, Bld: 83 mg/dL (ref 70–99)
Potassium: 4.2 mEq/L (ref 3.5–5.1)
Sodium: 140 mEq/L (ref 135–145)
Total Bilirubin: 0.3 mg/dL (ref 0.2–1.2)
Total Protein: 6.9 g/dL (ref 6.0–8.3)

## 2021-03-10 LAB — LIPID PANEL
Cholesterol: 149 mg/dL (ref 0–200)
HDL: 59.9 mg/dL (ref >39.00)
LDL Cholesterol: 76 mg/dL (ref 0–99)
NonHDL: 89.31
Total CHOL/HDL Ratio: 2
Triglycerides: 67 mg/dL (ref 0.0–149.0)
VLDL: 13.4 mg/dL (ref 0.0–40.0)

## 2021-03-10 LAB — HEMOGLOBIN A1C: Hgb A1c MFr Bld: 5.5 % (ref 4.6–6.5)

## 2021-03-10 MED ORDER — PFIZER-BIONT COVID-19 VAC-TRIS 30 MCG/0.3ML IM SUSP
INTRAMUSCULAR | 0 refills | Status: AC
  Filled 2021-03-10: qty 0.3, 1d supply, fill #0

## 2021-03-10 NOTE — Patient Instructions (Addendum)
Please schedule a routine eye exam. Please schedule an appointment with psychiatry- see below.  Please get a covid booster shot. Complete lab work prior to leaving.  Psychiatric Services:  Dr. Milagros Evener [facility]) - [phone] [facility]) - [phone] [facility] [place]) [phone] [facility]) [phone] [facility] [place]) - [phone] [facility] [place]) - [phone] [facility], [address] [place], [place] [phone]

## 2021-03-10 NOTE — Progress Notes (Signed)
   Covid-19 Vaccination Clinic  Name:  Martha Bray    MRN: [id number] DOB: Jul 25, 1996  03/10/2021  [name] was observed post Covid-19 immunization for 15 minutes without incident. She was provided with Vaccine Information Sheet and instruction to access the V-Safe system.   [name] was instructed to call 911 with any severe reactions post vaccine: Marland Kitchen Difficulty breathing  . Swelling of face and throat  . A fast heartbeat  . A bad rash all over body  . Dizziness and weakness   Immunizations Administered    Name Date Dose VIS Date Route   PFIZER Comrnaty(Gray TOP) Covid-19 Vaccine 03/10/2021  9:34 AM 0.3 mL 10/20/2020 Intramuscular   Manufacturer: [employer], [employer]   Lot: [id number]   NDC: [phone]

## 2021-03-10 NOTE — Assessment & Plan Note (Signed)
Uncontrolled. Will refer to psychiatry for ongoing management.continue wellbutrin xl 300 and buspar 7.5mg  bid.  Atarax 25mg  prn.

## 2021-03-10 NOTE — Telephone Encounter (Signed)
Could you please place a referral to audiology for this patient? Dx Tinnitus.  She states her insurance will not accept referrals from NP's. Tks.

## 2021-03-10 NOTE — Assessment & Plan Note (Signed)
Discussed healthy diet, exercise, weight loss. Recommended covid booster shot.  Pap up to date. Recommended routine eye exam.

## 2021-03-10 NOTE — Assessment & Plan Note (Signed)
Refer to audiology for formal hearing test.

## 2021-03-10 NOTE — Telephone Encounter (Signed)
All set!

## 2021-03-10 NOTE — Progress Notes (Signed)
Established Patient Office Visit  Subjective:  Patient ID: Martha Bray, female    DOB: June 28, 1996  Age: 25 y.o. MRN: [id number]  CC:  Chief Complaint  Patient presents with  . Anxiety  . Depression    Here for follow up    HPI Martha Bray presents for annual physical.  Patient presents today for complete physical.  Immunizations: tdap 2017 Diet: working on a healthy diet Exercise: a lot of walking at her job [employer] Readings from Last 3 Encounters:  03/10/21 232 lb (105.2 kg)  01/27/21 238 lb (108 kg)  12/16/20 244 lb 3.2 oz (110.8 kg)  Pap Smear: 07/07/19 Vision: due Dental: up to date  Depression/Anxiety- last visit she was noted to have ongoing symptoms of depression/anxiety. We increased her wellbutrin xl to 300mg  once daily and continued her on Buspar and prn atarax for anxiety. Reports that the anxiety "is the worst" part.  Reports that she does have panic attacks.  Atarax helps but makes her sleepy so she needs to take   Past Medical History:  Diagnosis Date  . Dermatitis 11/14/2016  . DOPA responsive dystonia   . Dystonia    doparesponsive dystonia    Past Surgical History:  Procedure Laterality Date  . WISDOM TOOTH EXTRACTION  03/2018    No family history on file.  Social History   Socioeconomic History  . Marital status: Single    Spouse name: Not on file  . Number of children: Not on file  . Years of education: Not on file  . Highest education level: Not on file  Occupational History  . Occupation: 04/2018  Tobacco Use  . Smoking status: Never Smoker  . Smokeless tobacco: Never Used  Vaping Use  . Vaping Use: Never used  Substance and Sexual Activity  . Alcohol use: Yes    Alcohol/week: 0.0 standard drinks    Comment: rarely  . Drug use: No  . Sexual activity: Not on file  Other Topics Concern  . 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 buPROPion (WELLBUTRIN XL) 300 MG 24 hr tablet Take 1 tablet (300 mg total) by mouth daily. 30 tablet 1  . busPIRone (BUSPAR) 7.5 MG tablet Take 1 tablet (7.5 mg total) by mouth 2 (two) times daily. 60 tablet 1  . carbidopa-levodopa (SINEMET IR) 25-100 MG tablet Take 1 tablet by mouth as needed. 2-3 times a week 30 tablet 11  . Carbidopa-Levodopa ER (SINEMET CR) 25-100 MG tablet controlled release Take 1 tablet by mouth daily. 30 tablet 11  . desogestrel-ethinyl estradiol (MIRCETTE) 0.15-0.02/0.01 MG (21/5) tablet Take 1 tablet by mouth daily.    . hydrOXYzine (ATARAX/VISTARIL) 25 MG tablet Take 1 tablet (25 mg total) by mouth 3 (three) times daily as needed for anxiety. 30 tablet 1  . sertraline (ZOLOFT) 25 MG tablet 1 tablet daily for 2 weeks, then 1/2 tab daily for 2 weeks then stop 21 tablet 0   No facility-administered medications prior to visit.    Allergies  Allergen Reactions  . Augmentin [Amoxicillin-Pot Clavulanate]  Vulvar rash    ROS Review of Systems  Constitutional: Negative for unexpected weight change.  HENT: Positive for tinnitus. Negative for hearing loss and rhinorrhea.   Eyes: Negative for visual disturbance.  Respiratory: Negative for cough and shortness of breath.   Cardiovascular: Negative for chest pain.  Gastrointestinal: Negative for constipation and diarrhea.  Genitourinary: Negative for dysuria, frequency, hematuria and menstrual problem.  Musculoskeletal: Negative for arthralgias and myalgias.  Skin: Negative for rash.  Neurological: Negative for headaches.  Hematological: Negative for adenopathy.  Psychiatric/Behavioral:       See HPI      Objective:    Physical  Exam  BP 117/63 (BP Location: Right Arm, Patient Position: Sitting, Cuff Size: Large)   Pulse 93   Temp 98.7 F (37.1 C) (Oral)   Resp 16   Ht 5\' 4"  (1.626 m)   Wt 232 lb (105.2 kg)   SpO2 99%   BMI 39.82 kg/m  Wt Readings from Last 3 Encounters:  03/10/21 232 lb (105.2 kg)  01/27/21 238 lb (108 kg)  12/16/20 244 lb 3.2 oz (110.8 kg)   Physical Exam  Constitutional: She is oriented to person, place, and time. She appears well-developed and well-nourished. No distress.  HENT:  Head: Normocephalic and atraumatic.  Right Ear: Tympanic membrane and ear canal normal.  Left Ear: Tympanic membrane and ear canal normal.  Mouth/Throat: Not examined- pt wearing mask Eyes: Pupils are equal, round, and reactive to light. No scleral icterus.  Neck: Normal range of motion. No thyromegaly present.  Cardiovascular: Normal rate and regular rhythm.   No murmur heard. Pulmonary/Chest: Effort normal and breath sounds normal. No respiratory distress. He has no wheezes. She has no rales. She exhibits no tenderness.  Abdominal: Soft. Bowel sounds are normal. She exhibits no distension and no mass. There is no tenderness. There is no rebound and no guarding.  Musculoskeletal: She exhibits no edema.  Lymphadenopathy:    She has no cervical adenopathy.  Neurological: She is alert and oriented to person, place, and time. She has normal patellar reflexes. She exhibits normal muscle tone. Coordination normal.  Skin: Skin is warm and dry.  Psychiatric: She has a normal mood and affect. Her behavior is normal. Judgment and thought content normal.  Breast/pelvic: deferred to GYN          Assessment & Plan:     Health Maintenance Due  Topic Date Due  . Hepatitis C Screening  Never done  . COVID-19 Vaccine (2 - Booster for Janssen series) 04/15/2020    There are no preventive care reminders to display for this patient.  Lab Results  Component Value Date   TSH 2.27 11/18/2018   Lab Results   Component Value Date   WBC 18.2 (H) 01/29/2020   HGB 10.6 (L) 01/29/2020   HCT 34.6 (L) 01/29/2020   MCV 78.6 (L) 01/29/2020   PLT 166 01/29/2020   Lab Results  Component Value Date   NA 138 01/29/2020   K 4.0 01/29/2020   CO2 25 01/29/2020   GLUCOSE 114 (H) 01/29/2020   BUN 10 01/29/2020   CREATININE 0.62 01/29/2020   BILITOT 0.7 01/26/2020   ALKPHOS 140 (H) 01/26/2020   AST 41 01/26/2020   ALT 24 01/26/2020   PROT 8.9 (H) 01/26/2020   ALBUMIN 3.1 (L) 01/26/2020   CALCIUM 7.7 (L) 01/29/2020   ANIONGAP 8 01/29/2020   Lab Results  Component Value Date   CHOL 137 03/07/2016   Lab Results  Component Value Date   HDL 45.90 03/07/2016   Lab Results  Component Value Date   LDLCALC 82 03/07/2016   Lab Results  Component Value Date   TRIG 49.0 03/07/2016   Lab Results  Component Value Date   CHOLHDL 3 03/07/2016   No results found for: HGBA1C    Assessment & Plan:   Problem List Items Addressed This Visit   None     No orders of the defined types were placed in this encounter.   Follow-up: No follow-ups on file.    Lemont Fillers, NP

## 2021-03-30 ENCOUNTER — Encounter: Payer: 59 | Admitting: Family Medicine

## 2021-04-20 ENCOUNTER — Other Ambulatory Visit: Payer: Self-pay | Admitting: Family

## 2021-05-22 ENCOUNTER — Encounter (INDEPENDENT_AMBULATORY_CARE_PROVIDER_SITE_OTHER): Payer: Self-pay

## 2021-05-23 ENCOUNTER — Other Ambulatory Visit: Payer: Self-pay | Admitting: Family

## 2021-06-22 ENCOUNTER — Other Ambulatory Visit: Payer: Self-pay | Admitting: Family

## 2021-07-21 ENCOUNTER — Other Ambulatory Visit: Payer: Self-pay | Admitting: Family

## 2021-08-23 ENCOUNTER — Ambulatory Visit: Payer: 59 | Admitting: Family

## 2021-08-23 ENCOUNTER — Other Ambulatory Visit: Payer: Self-pay

## 2021-08-23 VITALS — BP 112/73 | HR 101 | Temp 98.7°F | Resp 16 | Wt 229.0 lb

## 2021-08-23 DIAGNOSIS — F418 Other specified anxiety disorders: Secondary | ICD-10-CM | POA: Diagnosis not present

## 2021-08-23 DIAGNOSIS — H6981 Other specified disorders of Eustachian tube, right ear: Secondary | ICD-10-CM | POA: Diagnosis not present

## 2021-08-23 NOTE — Assessment & Plan Note (Signed)
New.  Overall improved. Normal ear exam. No sign of OM on exam. Pt is advised as follows:  Call if symptoms worsen or if they don't continue to improve.  You can try adding flonase 2 sprays each nostril once daily and claritin 10mg  once daily as needed for ear pressure.

## 2021-08-23 NOTE — Assessment & Plan Note (Signed)
Stable/improved. Continue wellbutrin xl 300mg  and buspar 7.5mg  bid.

## 2021-08-23 NOTE — Progress Notes (Signed)
Subjective:     Patient ID: Martha Bray, female    DOB: Jul 20, 1996, 25 y.o.   MRN: [id number]  Chief Complaint  Patient presents with   Ear Pain    Complains of ear pain on the right     HPI Patient is in today for ear pain. Had severe right ear pain on Saturday with increase in Tinnitus symptoms. She denies fever.  Today she notes very minimal right ear discomfort.   Depression/anxiety- She is maintained on wellbutrin xl 300mg  and buspar 7.5mg  and symptoms are stable. Reports increased compliance with medication.   Health Maintenance Due  Topic Date Due   Hepatitis C Screening  Never done   INFLUENZA VACCINE  Never done    Past Medical History:  Diagnosis Date   Dermatitis 11/14/2016   DOPA responsive dystonia    Dystonia    doparesponsive dystonia   Infectious mononucleosis 01/28/2020    Past Surgical History:  Procedure Laterality Date   WISDOM TOOTH EXTRACTION  03/2018    Family History  Problem Relation Age of Onset   Diabetes Mellitus II Maternal Aunt     Social History   Socioeconomic History   Marital status: Single    Spouse name: Not on file   Number of children: Not on file   Years of education: Not on file   Highest education level: Not on file  Occupational History   Occupation: student  Tobacco Use   Smoking status: Never   Smokeless tobacco: Never  Vaping Use   Vaping Use: Never used  Substance and Sexual Activity   Alcohol use: Yes    Alcohol/week: 0.0 standard drinks    Comment: rarely   Drug use: No   Sexual activity: Yes    Partners: Male  Other Topics Concern   Not on file  Social History Narrative   Lives with Mom   Works for 04/2018   5 cats    Has an older brother   Older sister passed at age 42   Social Determinants of Health   Financial Resource Strain: Not on file  Food Insecurity: Not on file  Transportation Needs: Not on file  Physical Activity: Not on file  Stress: Not on file  Social  Connections: Not on file  Intimate Partner Violence: Not on file    Outpatient Medications Prior to Visit  Medication Sig Dispense Refill   buPROPion (WELLBUTRIN XL) 300 MG 24 hr tablet TAKE ONE TABLET BY MOUTH DAILY 90 tablet 1   busPIRone (BUSPAR) 7.5 MG tablet TAKE ONE TABLET BY MOUTH TWICE A DAY 60 tablet 1   carbidopa-levodopa (SINEMET IR) 25-100 MG tablet Take 1 tablet by mouth as needed. 2-3 times a week 30 tablet 11   Carbidopa-Levodopa ER (SINEMET CR) 25-100 MG tablet controlled release Take 1 tablet by mouth daily. 30 tablet 11   COVID-19 mRNA Vac-TriS, Pfizer, (PFIZER-BIONT COVID-19 VAC-TRIS) SUSP injection Inject into the muscle. 0.3 mL 0   desogestrel-ethinyl estradiol (MIRCETTE) 0.15-0.02/0.01 MG (21/5) tablet Take 1 tablet by mouth daily.     hydrOXYzine (ATARAX/VISTARIL) 25 MG tablet Take 1 tablet (25 mg total) by mouth 3 (three) times daily as needed for anxiety. 30 tablet 1   sertraline (ZOLOFT) 25 MG tablet 1 tablet daily for 2 weeks, then 1/2 tab daily for 2 weeks then stop 21 tablet 0   No facility-administered medications prior to visit.    Allergies  Allergen Reactions   Augmentin [Amoxicillin-Pot Clavulanate]  Vulvar rash    ROS  See HPI    Objective:    Physical Exam Constitutional:      Appearance: She is well-developed.  HENT:     Head: Normocephalic and atraumatic.     Right Ear: Tympanic membrane and ear canal normal.     Left Ear: Tympanic membrane and ear canal normal.  Cardiovascular:     Rate and Rhythm: Normal rate and regular rhythm.     Heart sounds: Normal heart sounds. No murmur heard. Pulmonary:     Effort: Pulmonary effort is normal. No respiratory distress.     Breath sounds: Normal breath sounds. No wheezing.  Lymphadenopathy:     Cervical: No cervical adenopathy.  Psychiatric:        Behavior: Behavior normal.        Thought Content: Thought content normal.        Judgment: Judgment normal.    BP 112/73 (BP Location:  Right Arm, Patient Position: Sitting, Cuff Size: Large)   Pulse (!) 101   Temp 98.7 F (37.1 C) (Oral)   Resp 16   Wt 229 lb (103.9 kg)   BMI 39.31 kg/m  Wt Readings from Last 3 Encounters:  08/23/21 229 lb (103.9 kg)  03/10/21 232 lb (105.2 kg)  01/27/21 238 lb (108 kg)       Assessment & Plan:   Problem List Items Addressed This Visit       Unprioritized   Dysfunction of right eustachian tube - Primary    New.  Overall improved. Normal ear exam. No sign of OM on exam. Pt is advised as follows:  Call if symptoms worsen or if they don't continue to improve.  You can try adding flonase 2 sprays each nostril once daily and claritin 10mg  once daily as needed for ear pressure.       Depression with anxiety    Stable/improved. Continue wellbutrin xl 300mg  and buspar 7.5mg  bid.        I have discontinued Blessyn Betty's sertraline. I am also having her maintain her desogestrel-ethinyl estradiol, Carbidopa-Levodopa ER, carbidopa-levodopa, hydrOXYzine, Pfizer-BioNT COVID-19 Vac-TriS, busPIRone, and buPROPion.  No orders of the defined types were placed in this encounter.

## 2021-08-23 NOTE — Patient Instructions (Signed)
Call if symptoms worsen or if they don't continue to improve.  You can try adding flonase 2 sprays each nostril once daily and claritin 10mg  once daily as needed for ear pressure.

## 2021-08-24 ENCOUNTER — Other Ambulatory Visit: Payer: Self-pay | Admitting: Family

## 2021-12-15 NOTE — Progress Notes (Signed)
° ° ° °  Assessment/Plan:   1.  Dopa responsive dystonia  -take carbidopa/levodopa 25/100 CR, 1 tablet in the morning and she can take the increased carbidopa/levodopa 25/100 IR, about 4 times per week.  She has been more active with roller Derby and found that she needed the immediate release more often because of that.  -Patient on oral contraceptives.  Avoiding pregnancy while on levodopa.  Would likely trial off levodopa if patient wanted to get pregnant.  Discussed this again today.  2.  F/u 1 year  Subjective   Patient seen today in follow-up for dopa responsive dystonia.  Outside records that were made available to me were reviewed.  No major changes.   She has increased her carbidopa/levodopa IR to 4-5 times per week (was using 2-3 times prn) and still using the carbidopa/levodopa CR in the AM daily.  She is doing roller derby for fun and thinks that is why she is having to use the levodopa more often.  She will often note needing the extra in the late evening (about 6pm) and she will have an "itchy" feeling.  Generally, feet do not cramp unless she does not get her morning meds on time.  On birth control.    Current movement d/o meds:  Carbidopa/levodopa 25/100 CR, 1 tablet in the morning Carbidopa/levodopa 25/100 IR, 1 tablet in the evening (does not do this every day)   Current Outpatient Medications on File Prior to Visit  Medication Sig Dispense Refill   buPROPion (WELLBUTRIN XL) 300 MG 24 hr tablet TAKE ONE TABLET BY MOUTH DAILY 90 tablet 1   busPIRone (BUSPAR) 7.5 MG tablet TAKE ONE TABLET BY MOUTH TWICE A DAY 60 tablet 5   carbidopa-levodopa (SINEMET IR) 25-100 MG tablet Take 1 tablet by mouth as needed. 2-3 times a week 30 tablet 11   Carbidopa-Levodopa ER (SINEMET CR) 25-100 MG tablet controlled release Take 1 tablet by mouth daily. 30 tablet 11   COVID-19 mRNA Vac-TriS, Pfizer, (PFIZER-BIONT COVID-19 VAC-TRIS) SUSP injection Inject into the muscle. 0.3 mL 0    desogestrel-ethinyl estradiol (MIRCETTE) 0.15-0.02/0.01 MG (21/5) tablet Take 1 tablet by mouth daily.     hydrOXYzine (ATARAX/VISTARIL) 25 MG tablet Take 1 tablet (25 mg total) by mouth 3 (three) times daily as needed for anxiety. 30 tablet 1   No current facility-administered medications on file prior to visit.     Objective   Vitals:   12/19/21 1054  BP: 115/69  Pulse: (!) 101  SpO2: 96%  Weight: 226 lb 9.6 oz (102.8 kg)  Height: 5\' 3"  (1.6 m)    GEN:  The patient appears stated age and is in NAD.  Neurological examination:  Orientation: The patient is alert and oriented x3. Cranial nerves: There is good facial symmetry. There is no facial hypomimia.  The speech is fluent and clear. Soft palate rises symmetrically and there is no tongue deviation. Hearing is intact to conversational tone. Motor: Strength is 5/5 in the bilateral upper and lower extremities. Deep tendon reflexes: Deep tendon reflexes are 2/4 at the bilateral biceps, triceps, brachioradialis, 3/4 at the bilateral patella (with prepatellar reflexes).  Movement examination: Tone: Good in the upper and lower extremities Abnormal movements: none Coordination:  There is no decremation with RAM's Gait and Station: The patient ambulates well in the hall.  Armswing is good.  No significant dystonic posturing of the left foot.    , DO

## 2021-12-18 ENCOUNTER — Telehealth: Payer: Self-pay | Admitting: Family

## 2021-12-18 DIAGNOSIS — G241 Genetic torsion dystonia: Secondary | ICD-10-CM

## 2021-12-18 NOTE — Telephone Encounter (Signed)
Pt is requesting referral to Dr. Arbutus Leas again. Stated she gets this done every year. She did feel the need to schedule an appointment with Melissa to discuss this.

## 2021-12-19 ENCOUNTER — Encounter: Payer: Self-pay | Admitting: Neurology

## 2021-12-19 ENCOUNTER — Ambulatory Visit: Payer: 59 | Admitting: Neurology

## 2021-12-19 ENCOUNTER — Other Ambulatory Visit: Payer: Self-pay

## 2021-12-19 VITALS — BP 115/69 | HR 101 | Ht 63.0 in | Wt 226.6 lb

## 2021-12-19 DIAGNOSIS — G241 Genetic torsion dystonia: Secondary | ICD-10-CM

## 2021-12-19 MED ORDER — CARBIDOPA-LEVODOPA 25-100 MG PO TABS: ORAL_TABLET | ORAL | 11 refills | Status: DC

## 2021-12-19 MED ORDER — CARBIDOPA-LEVODOPA ER 25-100 MG PO TBCR: 1.0000 | EXTENDED_RELEASE_TABLET | Freq: Every day | ORAL | 11 refills | Status: DC

## 2021-12-19 NOTE — Patient Instructions (Signed)
Continue birth control  Make sure if you decide to get pregnant that we plan it  The physicians and staff at [facility] are committed to providing excellent care. You may receive a survey requesting feedback about your experience at our office. We strive to receive "very good" responses to the survey questions. If you feel that your experience would prevent you from giving the office a "very good " response, please contact our office to try to remedy the situation. We may be reached at [phone]. Thank you for taking the time out of your busy day to complete the survey.

## 2022-01-22 ENCOUNTER — Other Ambulatory Visit: Payer: Self-pay | Admitting: Family

## 2022-03-30 ENCOUNTER — Other Ambulatory Visit: Payer: Self-pay | Admitting: Family

## 2022-04-25 ENCOUNTER — Other Ambulatory Visit: Payer: Self-pay | Admitting: Family

## 2022-07-02 ENCOUNTER — Other Ambulatory Visit: Payer: Self-pay | Admitting: Family

## 2022-07-02 NOTE — Telephone Encounter (Signed)
Please contact pt to schedule a cpx.

## 2022-07-26 ENCOUNTER — Telehealth: Payer: Self-pay | Admitting: Family

## 2022-07-26 NOTE — Telephone Encounter (Signed)
Lvm to call back

## 2022-07-26 NOTE — Telephone Encounter (Signed)
Please call to schedule follow up visit.

## 2022-10-06 ENCOUNTER — Other Ambulatory Visit: Payer: Self-pay | Admitting: Family

## 2022-11-01 ENCOUNTER — Other Ambulatory Visit: Payer: Self-pay | Admitting: Family

## 2022-11-01 NOTE — Telephone Encounter (Signed)
Needs to return April 2024

## 2022-12-19 ENCOUNTER — Ambulatory Visit: Payer: 59 | Admitting: Neurology

## 2023-01-13 ENCOUNTER — Other Ambulatory Visit: Payer: Self-pay | Admitting: Neurology

## 2023-01-14 NOTE — Telephone Encounter (Signed)
30 days only per Dr.Tat

## 2023-02-07 ENCOUNTER — Other Ambulatory Visit: Payer: Self-pay | Admitting: Family

## 2023-02-07 ENCOUNTER — Encounter: Payer: Self-pay | Admitting: *Deleted

## 2023-02-13 NOTE — Progress Notes (Signed)
     Assessment/Plan:   1.  Dopa responsive dystonia  -take carbidopa/levodopa 25/100 CR, 1 tablet in the morning and she can take the carbidopa/levodopa 25/100 IR, about 3- 4 times per week.  She is active with roller Derby and finds that she needed the immediate release when active with that.  Sx's worse with activity/adrenaline  -Patient has IUD, inserted December 07, 2022.  Avoiding pregnancy while on levodopa.  Would likely trial off levodopa if patient wanted to get pregnant.  Discussed this again today.  -chem today  2.  F/u 1 year  Subjective   Patient seen today in follow-up for dopa responsive dystonia.  Outside records that were made available to me were reviewed.  No major changes.   She currently takes her immediate release levodopa approximately 3-4 times per week.  She takes her extended release in the morning daily.  Sx's when active consist of a tightness and feeling that it is difficult to use the hands and upper thigh.  Notes available to me are reviewed.  On January 26 she was seen by GYN and had IUD inserted.    Current movement d/o meds:  Carbidopa/levodopa 25/100 CR, 1 tablet in the morning Carbidopa/levodopa 25/100 IR, 1 tablet in the evening (does not do this every day)   Current Outpatient Medications on File Prior to Visit  Medication Sig Dispense Refill   buPROPion (WELLBUTRIN XL) 300 MG 24 hr tablet TAKE 1 TABLET BY MOUTH DAILY 30 tablet 0   busPIRone (BUSPAR) 7.5 MG tablet TAKE 1 TABLET BY MOUTH TWICE A DAY 180 tablet 0   carbidopa-levodopa (SINEMET IR) 25-100 MG tablet TAKE ONE TABLET BY MOUTH DAILY AS NEEDED 4 DAYS PER WEEK 30 tablet 0   Carbidopa-Levodopa ER (SINEMET CR) 25-100 MG tablet controlled release Take 1 tablet by mouth daily. 30 tablet 11   COVID-19 mRNA Vac-TriS, Pfizer, (PFIZER-BIONT COVID-19 VAC-TRIS) SUSP injection Inject into the muscle. 0.3 mL 0   hydrOXYzine (ATARAX/VISTARIL) 25 MG tablet Take 1 tablet (25 mg total) by mouth 3 (three) times  daily as needed for anxiety. 30 tablet 1   No current facility-administered medications on file prior to visit.     Objective   Vitals:   02/15/23 0924  BP: 120/83  Pulse: 89  SpO2: 97%  Weight: 264 lb 12.8 oz (120.1 kg)  Height: 5\' 3"  (1.6 m)   Wt Readings from Last 3 Encounters:  02/15/23 264 lb 12.8 oz (120.1 kg)  12/19/21 226 lb 9.6 oz (102.8 kg)  08/23/21 229 lb (103.9 kg)     GEN:  The patient appears stated age and is in NAD. CV:  RRR Lungs:  CTAB Neck:  no bruits  Neurological examination:  Orientation: The patient is alert and oriented x3. Cranial nerves: There is good facial symmetry. There is no facial hypomimia.  The speech is fluent and clear. Soft palate rises symmetrically and there is no tongue deviation. Hearing is intact to conversational tone. Motor: Strength is 5/5 in the bilateral upper and lower extremities. Deep tendon reflexes: Deep tendon reflexes are 2/4   Movement examination: Tone: Good in the upper and lower extremities Abnormal movements: none Coordination:  There is no decremation with RAM's Gait and Station: The patient ambulates well in the hall and jogs in the hall without trouble    Lurena Joiner Damaya Channing, DO

## 2023-02-15 ENCOUNTER — Encounter: Payer: Self-pay | Admitting: Neurology

## 2023-02-15 ENCOUNTER — Ambulatory Visit (INDEPENDENT_AMBULATORY_CARE_PROVIDER_SITE_OTHER): Payer: 59 | Admitting: Neurology

## 2023-02-15 ENCOUNTER — Other Ambulatory Visit (INDEPENDENT_AMBULATORY_CARE_PROVIDER_SITE_OTHER): Payer: 59

## 2023-02-15 VITALS — BP 120/83 | HR 89 | Ht 63.0 in | Wt 264.8 lb

## 2023-02-15 DIAGNOSIS — Z79899 Other long term (current) drug therapy: Secondary | ICD-10-CM

## 2023-02-15 DIAGNOSIS — G241 Genetic torsion dystonia: Secondary | ICD-10-CM

## 2023-02-15 LAB — COMPREHENSIVE METABOLIC PANEL
ALT: 2 U/L (ref 0–35)
AST: 13 U/L (ref 0–37)
Albumin: 4.1 g/dL (ref 3.5–5.2)
Alkaline Phosphatase: 85 U/L (ref 39–117)
BUN: 11 mg/dL (ref 6–23)
CO2: 28 mEq/L (ref 19–32)
Calcium: 9.3 mg/dL (ref 8.4–10.5)
Chloride: 104 mEq/L (ref 96–112)
Creatinine, Ser: 0.57 mg/dL (ref 0.40–1.20)
GFR: 126.83 mL/min (ref >60.00)
Glucose, Bld: 87 mg/dL (ref 70–99)
Potassium: 4.3 mEq/L (ref 3.5–5.1)
Sodium: 139 mEq/L (ref 135–145)
Total Bilirubin: 0.4 mg/dL (ref 0.2–1.2)
Total Protein: 7.1 g/dL (ref 6.0–8.3)

## 2023-02-15 MED ORDER — CARBIDOPA-LEVODOPA ER 25-100 MG PO TBCR: 1.0000 | EXTENDED_RELEASE_TABLET | Freq: Every day | ORAL | 11 refills | Status: DC

## 2023-02-15 MED ORDER — CARBIDOPA-LEVODOPA 25-100 MG PO TABS: ORAL_TABLET | ORAL | 11 refills | Status: DC

## 2023-02-15 NOTE — Patient Instructions (Signed)
Your provider has requested that you have labwork completed today. The lab is located on the Second floor at [address], within the [facility] office. When you get off the elevator, turn right and go in the [facility] [address]; the first brown door on the left.  Tell the ladies behind the desk that you are there for lab work. If you are not called within 15 minutes please check with the front desk.   Once you complete your labs you are free to go. You will receive a call or message via MyChart with your lab results.    

## 2024-02-13 NOTE — Progress Notes (Signed)
     Assessment/Plan:   1.  Dopa responsive dystonia  -take carbidopa/levodopa 25/100 CR, 1 tablet in the morning and she can take the carbidopa/levodopa 25/100 IR, about 3- 4 times per week.  She is active with roller Derby and finds that she needed the immediate release when active with that.  Sx's worse with activity/adrenaline  -Patient has IUD, inserted December 07, 2022.  Avoiding pregnancy while on levodopa.  Would likely trial off levodopa if patient wanted to get pregnant.  Discussed this again today.  -cmp, tsh  2.  F/u 1 year  Subjective   Patient seen today in follow-up for dopa responsive dystonia.  Outside records that were made available to me were reviewed.  No major changes.   She currently takes her immediate release levodopa approximately 3-4 times per week - she needs it when more stressed.  She takes her extended release in the morning daily.  She's doing well with the medication.  She has an IUD.    Current movement d/o meds:  Carbidopa/levodopa 25/100 CR, 1 tablet in the morning Carbidopa/levodopa 25/100 IR, 1 tablet prn (3-4 times per week)   Current Outpatient Medications on File Prior to Visit  Medication Sig Dispense Refill   buPROPion (WELLBUTRIN XL) 300 MG 24 hr tablet TAKE 1 TABLET BY MOUTH DAILY 30 tablet 0   busPIRone (BUSPAR) 7.5 MG tablet TAKE 1 TABLET BY MOUTH TWICE A DAY 180 tablet 0   carbidopa-levodopa (SINEMET IR) 25-100 MG tablet TAKE ONE TABLET BY MOUTH DAILY AS NEEDED 4 DAYS PER WEEK 30 tablet 11   Carbidopa-Levodopa ER (SINEMET CR) 25-100 MG tablet controlled release Take 1 tablet by mouth daily. 30 tablet 11   COVID-19 mRNA Vac-TriS, Pfizer, (PFIZER-BIONT COVID-19 VAC-TRIS) SUSP injection Inject into the muscle. 0.3 mL 0   hydrOXYzine (ATARAX/VISTARIL) 25 MG tablet Take 1 tablet (25 mg total) by mouth 3 (three) times daily as needed for anxiety. 30 tablet 1   No current facility-administered medications on file prior to visit.     Objective    Vitals:   02/17/24 0811  BP: 126/88  Pulse: 81  SpO2: 96%  Weight: 288 lb 9.6 oz (130.9 kg)  Height: 5\' 3"  (1.6 m)    Wt Readings from Last 3 Encounters:  02/17/24 288 lb 9.6 oz (130.9 kg)  02/15/23 264 lb 12.8 oz (120.1 kg)  12/19/21 226 lb 9.6 oz (102.8 kg)     GEN:  The patient appears stated age and is in NAD. CV:  RRR Lungs:  CTAB Neck:  no bruits  Neurological examination:  Orientation: The patient is alert and oriented x3. Cranial nerves: There is good facial symmetry. There is no facial hypomimia.  The speech is fluent and clear. Soft palate rises symmetrically and there is no tongue deviation. Hearing is intact to conversational tone. Motor: Strength is 5/5 in the bilateral upper and lower extremities. Deep tendon reflexes: Deep tendon reflexes are 2/4   Movement examination: Tone: Good in the upper and lower extremities Abnormal movements: none Coordination:  There is no decremation with RAM's Gait and Station: The patient ambulates well in the hall with good arm swing.  No dystonia  Lab Results  Component Value Date   TSH 2.27 11/18/2018      Lurena Joiner Emery Dupuy, DO

## 2024-02-17 ENCOUNTER — Encounter: Payer: Self-pay | Admitting: Neurology

## 2024-02-17 ENCOUNTER — Ambulatory Visit (INDEPENDENT_AMBULATORY_CARE_PROVIDER_SITE_OTHER): Payer: 59 | Admitting: Neurology

## 2024-02-17 ENCOUNTER — Other Ambulatory Visit

## 2024-02-17 VITALS — BP 126/88 | HR 81 | Ht 63.0 in | Wt 288.6 lb

## 2024-02-17 DIAGNOSIS — G241 Genetic torsion dystonia: Secondary | ICD-10-CM

## 2024-02-17 DIAGNOSIS — Z5181 Encounter for therapeutic drug level monitoring: Secondary | ICD-10-CM

## 2024-02-17 LAB — COMPREHENSIVE METABOLIC PANEL WITH GFR
AG Ratio: 1.2 (calc) (ref 1.0–2.5)
ALT: 3 U/L — ABNORMAL LOW (ref 6–29)
AST: 10 U/L (ref 10–30)
Albumin: 3.9 g/dL (ref 3.6–5.1)
Alkaline phosphatase (APISO): 97 U/L (ref 31–125)
BUN: 12 mg/dL (ref 7–25)
CO2: 29 mmol/L (ref 20–32)
Calcium: 9.3 mg/dL (ref 8.6–10.2)
Chloride: 104 mmol/L (ref 98–110)
Creat: 0.67 mg/dL (ref 0.50–0.96)
Globulin: 3.3 g/dL (ref 1.9–3.7)
Glucose, Bld: 91 mg/dL (ref 65–99)
Potassium: 4.9 mmol/L (ref 3.5–5.3)
Sodium: 139 mmol/L (ref 135–146)
Total Bilirubin: 0.2 mg/dL (ref 0.2–1.2)
Total Protein: 7.2 g/dL (ref 6.1–8.1)
eGFR: 124 mL/min/{1.73_m2} (ref >=60)

## 2024-02-17 LAB — TSH: TSH: 3.35 m[IU]/L

## 2024-02-17 MED ORDER — CARBIDOPA-LEVODOPA 25-100 MG PO TABS: ORAL_TABLET | ORAL | 11 refills | Status: AC

## 2024-02-17 MED ORDER — CARBIDOPA-LEVODOPA ER 25-100 MG PO TBCR: 1.0000 | EXTENDED_RELEASE_TABLET | Freq: Every day | ORAL | 11 refills | Status: AC

## 2024-02-17 NOTE — Patient Instructions (Signed)
Your provider has requested that you have labwork completed today. The lab is located on the Second floor at [address], within the  [facility] office. When you get off the elevator, turn right and go in the  [facility] [address]; the first brown door on the left.  Tell the ladies behind the desk that you are there for lab work. If you are not called within 15 minutes please check with the front desk.   Once you complete your labs you are free to go. You will receive a call or message via MyChart with your lab results.    

## 2025-02-16 ENCOUNTER — Ambulatory Visit: Admitting: Neurology
